# Patient Record
Sex: Female | Born: 1935 | Race: Black or African American | Hispanic: No | Smoking: Never smoker
Health system: Southern US, Community
[De-identification: ages and names within clinical notes are randomized; demographics above are authoritative.]

## PROBLEM LIST (undated history)

## (undated) DIAGNOSIS — R634 Abnormal weight loss: Secondary | ICD-10-CM

## (undated) DIAGNOSIS — D649 Anemia, unspecified: Principal | ICD-10-CM

## (undated) DIAGNOSIS — E079 Disorder of thyroid, unspecified: Secondary | ICD-10-CM

## (undated) DIAGNOSIS — I4891 Unspecified atrial fibrillation: Secondary | ICD-10-CM

## (undated) DIAGNOSIS — K769 Liver disease, unspecified: Secondary | ICD-10-CM

## (undated) DIAGNOSIS — I10 Essential (primary) hypertension: Secondary | ICD-10-CM

## (undated) DIAGNOSIS — M1990 Unspecified osteoarthritis, unspecified site: Secondary | ICD-10-CM

## (undated) DIAGNOSIS — D472 Monoclonal gammopathy: Principal | ICD-10-CM

## (undated) HISTORY — DX: Abnormal weight loss: R63.4

## (undated) HISTORY — DX: Monoclonal gammopathy: D47.2

## (undated) HISTORY — DX: Liver disease, unspecified: K76.9

## (undated) HISTORY — DX: Unspecified osteoarthritis, unspecified site: M19.90

## (undated) HISTORY — DX: Unspecified atrial fibrillation: I48.91

## (undated) HISTORY — DX: Anemia, unspecified: D64.9

## (undated) HISTORY — DX: Disorder of thyroid, unspecified: E07.9

## (undated) HISTORY — DX: Essential (primary) hypertension: I10

---

## 2000-11-20 ENCOUNTER — Ambulatory Visit (HOSPITAL_COMMUNITY): Admission: RE | Admit: 2000-11-20 | Discharge: 2000-11-20 | Payer: Self-pay | Admitting: Family Medicine

## 2008-12-22 ENCOUNTER — Encounter: Admission: RE | Admit: 2008-12-22 | Discharge: 2008-12-22 | Payer: Self-pay | Admitting: Internal Medicine

## 2010-10-26 ENCOUNTER — Ambulatory Visit: Payer: Self-pay | Admitting: Internal Medicine

## 2011-09-20 ENCOUNTER — Ambulatory Visit (INDEPENDENT_AMBULATORY_CARE_PROVIDER_SITE_OTHER): Payer: Medicare Other

## 2011-09-20 DIAGNOSIS — M25569 Pain in unspecified knee: Secondary | ICD-10-CM

## 2011-09-20 DIAGNOSIS — M12369 Palindromic rheumatism, unspecified knee: Secondary | ICD-10-CM

## 2011-09-20 DIAGNOSIS — M1710 Unilateral primary osteoarthritis, unspecified knee: Secondary | ICD-10-CM

## 2011-11-10 ENCOUNTER — Ambulatory Visit (INDEPENDENT_AMBULATORY_CARE_PROVIDER_SITE_OTHER): Payer: Medicare Other | Admitting: Family Medicine

## 2011-11-10 DIAGNOSIS — Z87898 Personal history of other specified conditions: Secondary | ICD-10-CM

## 2011-11-10 DIAGNOSIS — I10 Essential (primary) hypertension: Secondary | ICD-10-CM

## 2011-11-10 DIAGNOSIS — E039 Hypothyroidism, unspecified: Secondary | ICD-10-CM

## 2011-11-10 LAB — COMPREHENSIVE METABOLIC PANEL
ALT: <8 U/L (ref 0–35)
AST: 14 U/L (ref 0–37)
Albumin: 4.2 g/dL (ref 3.5–5.2)
Alkaline Phosphatase: 87 U/L (ref 39–117)
Potassium: 5.1 mEq/L (ref 3.5–5.3)
Sodium: 135 mEq/L (ref 135–145)
Total Bilirubin: 0.4 mg/dL (ref 0.3–1.2)
Total Protein: 7.9 g/dL (ref 6.0–8.3)

## 2011-11-10 LAB — POCT CBC
Granulocyte percent: 55.3 %G (ref 37–80)
MCV: 89.2 fL (ref 80–97)
MID (cbc): 0.6 (ref 0–0.9)
MPV: 8.2 fL (ref 0–99.8)
POC Granulocyte: 4.9 (ref 2–6.9)
POC MID %: 6.7 %M (ref 0–12)
Platelet Count, POC: 291 10*3/uL (ref 142–424)
RBC: 4.42 M/uL (ref 4.04–5.48)
RDW, POC: 14.5 %

## 2011-11-10 LAB — TSH: TSH: 0.355 u[IU]/mL (ref 0.350–4.500)

## 2011-11-10 NOTE — Progress Notes (Signed)
  Subjective:    Patient ID: Kristin Jennings, female    DOB: 1916/01/31, 78 y.o.   MRN: [id number]  HPI has been feeling well. Still worries a lot about her weight, whether she has lost more not. She denies any new major problems. Her knee has done better since her last visit.    Review of Systems HEENT negative. Respiratory unremarkable. Cardiovascular unremarkable.. GI appetite is good. GU unremarkable. Right knee swells a little but doesn't bother her really.     Objective:   Physical Exam Elderly lady alert oriented no acute distress throat clear neck supple without nodes chest clear heart regular without murmurs abdomen soft masses tenderness or mild effusion in the right knee but nothing like it was before.       Assessment & Plan:  Hypothyroidism Hypertension History weight loss, stabilized  CBC TSH and seeing that. Return when necessary or in 5 months.

## 2011-11-10 NOTE — Patient Instructions (Addendum)
Continue to try to eating as much as you can.  Return in about July.

## 2011-11-14 ENCOUNTER — Encounter: Payer: Self-pay | Admitting: *Deleted

## 2012-01-20 ENCOUNTER — Other Ambulatory Visit: Payer: Self-pay | Admitting: Family Medicine

## 2012-01-20 MED ORDER — AMLODIPINE BESYLATE 5 MG PO TABS: 5.0000 mg | ORAL_TABLET | Freq: Every day | ORAL | Status: DC

## 2012-01-20 MED ORDER — HYDROCHLOROTHIAZIDE 12.5 MG PO CAPS: 12.5000 mg | ORAL_CAPSULE | Freq: Every day | ORAL | Status: DC

## 2012-01-20 MED ORDER — LEVOTHYROXINE SODIUM 50 MCG PO TABS: 50.0000 ug | ORAL_TABLET | Freq: Every day | ORAL | Status: DC

## 2012-01-23 ENCOUNTER — Other Ambulatory Visit: Payer: Self-pay | Admitting: *Deleted

## 2012-01-23 MED ORDER — LEVOTHYROXINE SODIUM 50 MCG PO TABS: 50.0000 ug | ORAL_TABLET | Freq: Every day | ORAL | Status: DC

## 2012-02-26 ENCOUNTER — Ambulatory Visit (INDEPENDENT_AMBULATORY_CARE_PROVIDER_SITE_OTHER): Payer: Medicare Other | Admitting: Family Medicine

## 2012-02-26 ENCOUNTER — Ambulatory Visit: Payer: Medicare Other

## 2012-02-26 VITALS — BP 138/68 | HR 54 | Temp 97.5°F | Resp 14 | Ht 66.0 in | Wt 108.4 lb

## 2012-02-26 DIAGNOSIS — I10 Essential (primary) hypertension: Secondary | ICD-10-CM

## 2012-02-26 DIAGNOSIS — E039 Hypothyroidism, unspecified: Secondary | ICD-10-CM

## 2012-02-26 DIAGNOSIS — M25511 Pain in right shoulder: Secondary | ICD-10-CM

## 2012-02-26 DIAGNOSIS — M25519 Pain in unspecified shoulder: Secondary | ICD-10-CM

## 2012-02-26 MED ORDER — MELOXICAM 7.5 MG PO TABS: 7.5000 mg | ORAL_TABLET | Freq: Every day | ORAL | Status: DC

## 2012-02-26 MED ORDER — LEVOTHYROXINE SODIUM 50 MCG PO TABS: 50.0000 ug | ORAL_TABLET | Freq: Every day | ORAL | Status: DC

## 2012-02-26 MED ORDER — HYDROCHLOROTHIAZIDE 12.5 MG PO CAPS: 12.5000 mg | ORAL_CAPSULE | Freq: Every day | ORAL | Status: DC

## 2012-02-26 MED ORDER — AMLODIPINE BESYLATE 5 MG PO TABS: 5.0000 mg | ORAL_TABLET | Freq: Every day | ORAL | Status: DC

## 2012-02-26 NOTE — Patient Instructions (Signed)
Exercise shoulder as explained to you.  Return in the fall for a repeat check. (I will be gone much of October)

## 2012-02-26 NOTE — Progress Notes (Signed)
Subjective: Patient is no longer employed. However she still is very active. She was bending over on her porch to work on some flowers and fell over, hitting the ground landing on her right shoulder. This was done days ago and continues to hurt her sutures concerned examining and get checked.  Objective:  Decreased range of motion of the shoulder,, cannot lift it above about 70. Is very tender in the anterior aspect of the shoulder. Neurovascular intact.  Assessment: Right shoulder pain  Plan:  X-ray right shoulder  Results for orders placed in visit on 11/10/11  POCT CBC      Component Value Range   WBC 8.8  4.6 - 10.2 (K/uL)   Lymph, poc 3.3  0.6 - 3.4    POC LYMPH PERCENT 38.0  10 - 50 (%L)   MID (cbc) 0.6  0 - 0.9    POC MID % 6.7  0 - 12 (%M)   POC Granulocyte 4.9  2 - 6.9    Granulocyte percent 55.3  37 - 80 (%G)   RBC 4.42  4.04 - 5.48 (M/uL)   Hemoglobin 12.3  12.2 - 16.2 (g/dL)   HCT, POC 52.8  41.3 - 47.9 (%)   MCV 89.2  80 - 97 (fL)   MCH, POC 27.8  27 - 31.2 (pg)   MCHC 31.2 (*) 31.8 - 35.4 (g/dL)   RDW, POC 24.4     Platelet Count, POC 291  142 - 424 (K/uL)   MPV 8.2  0 - 99.8 (fL)  TSH      Component Value Range   TSH 0.355  0.350 - 4.500 (uIU/mL)  COMPREHENSIVE METABOLIC PANEL      Component Value Range   Sodium 135  135 - 145 (mEq/L)   Potassium 5.1  3.5 - 5.3 (mEq/L)   Chloride 102  96 - 112 (mEq/L)   CO2 26  19 - 32 (mEq/L)   Glucose, Bld 115 (*) 70 - 99 (mg/dL)   BUN 22  6 - 23 (mg/dL)   Creat 0.10  2.72 - 5.36 (mg/dL)   Total Bilirubin 0.4  0.3 - 1.2 (mg/dL)   Alkaline Phosphatase 87  39 - 117 (U/L)   AST 14  0 - 37 (U/L)   ALT <8  0 - 35 (U/L)   Total Protein 7.9  6.0 - 8.3 (g/dL)   Albumin 4.2  3.5 - 5.2 (g/dL)   Calcium 9.5  8.4 - 64.4 (mg/dL)   [facility] reading (PRIMARY) by  Dr. Alwyn Ren Osteoporosis, no fx   .

## 2012-07-05 ENCOUNTER — Ambulatory Visit (INDEPENDENT_AMBULATORY_CARE_PROVIDER_SITE_OTHER): Payer: PRIVATE HEALTH INSURANCE | Admitting: Family Medicine

## 2012-07-05 VITALS — BP 118/80 | HR 85 | Temp 98.6°F | Resp 20 | Ht 60.5 in | Wt 105.2 lb

## 2012-07-05 DIAGNOSIS — IMO0002 Reserved for concepts with insufficient information to code with codable children: Secondary | ICD-10-CM

## 2012-07-05 DIAGNOSIS — Z23 Encounter for immunization: Secondary | ICD-10-CM

## 2012-07-05 DIAGNOSIS — M1710 Unilateral primary osteoarthritis, unspecified knee: Secondary | ICD-10-CM

## 2012-07-05 DIAGNOSIS — I10 Essential (primary) hypertension: Secondary | ICD-10-CM

## 2012-07-05 DIAGNOSIS — E039 Hypothyroidism, unspecified: Secondary | ICD-10-CM | POA: Insufficient documentation

## 2012-07-05 DIAGNOSIS — M1711 Unilateral primary osteoarthritis, right knee: Secondary | ICD-10-CM | POA: Insufficient documentation

## 2012-07-05 DIAGNOSIS — M25511 Pain in right shoulder: Secondary | ICD-10-CM

## 2012-07-05 MED ORDER — AMLODIPINE BESYLATE 5 MG PO TABS: 5.0000 mg | ORAL_TABLET | Freq: Every day | ORAL | Status: DC

## 2012-07-05 MED ORDER — MELOXICAM 7.5 MG PO TABS: 7.5000 mg | ORAL_TABLET | Freq: Every day | ORAL | Status: DC

## 2012-07-05 MED ORDER — LEVOTHYROXINE SODIUM 50 MCG PO TABS: 50.0000 ug | ORAL_TABLET | Freq: Every day | ORAL | Status: DC

## 2012-07-05 MED ORDER — HYDROCHLOROTHIAZIDE 12.5 MG PO CAPS: 12.5000 mg | ORAL_CAPSULE | Freq: Every day | ORAL | Status: DC

## 2012-07-05 NOTE — Patient Instructions (Signed)
Use ice pack Knee twice daily today and tomorrow  Continue current medications. Return in 4 months

## 2012-07-05 NOTE — Progress Notes (Signed)
Subjective: 78 year old lady who functions very well. She still does some iron for people. No major new problems. Her right knee stays swollen.  Objective: Pleasant alert lady. HEENT normal. No carotid bruits. Chest clear. Heart regular without murmurs. Abdomen soft without masses or tenderness. She does have a very large effusion of the right knee. The knee Floats.  Arthrocentesis: The knee will was accessed from a lateral aspect. It was numbed with 2% lidocaine. The fluid was aspirated, over 90 cc. It was a clear yellow fluid. Specimen will be sent for synovial fluid analysis. Using sterile technique the needle was changed and the Kenalog 40+ lidocaine 2% 1 cc each was injected. The patient tolerated the procedure well. She was instructed in his care.  Assessment: DJD right knee Hypertension Hypothyroidism  Plan: Continue her current medication. Take Mobic for one week. Return if needed, otherwise in about 4 months. Flu shot will be given to

## 2012-07-06 LAB — CELL COUNT + DIFF, W/O CRYST-SYNVL FLD
Eosinophils-Synovial: 0 % (ref 0–1)
Lymphocytes-Synovial Fld: 6 % (ref 0–20)
Monocyte/Macrophage: 9 % — ABNORMAL LOW (ref 50–90)

## 2012-11-05 ENCOUNTER — Other Ambulatory Visit: Payer: Self-pay | Admitting: *Deleted

## 2012-11-05 DIAGNOSIS — I10 Essential (primary) hypertension: Secondary | ICD-10-CM

## 2012-11-05 DIAGNOSIS — E039 Hypothyroidism, unspecified: Secondary | ICD-10-CM

## 2012-11-05 MED ORDER — AMLODIPINE BESYLATE 5 MG PO TABS: 5.0000 mg | ORAL_TABLET | Freq: Every day | ORAL | Status: DC

## 2012-11-05 MED ORDER — LEVOTHYROXINE SODIUM 50 MCG PO TABS: 50.0000 ug | ORAL_TABLET | Freq: Every day | ORAL | Status: DC

## 2012-11-13 ENCOUNTER — Ambulatory Visit (INDEPENDENT_AMBULATORY_CARE_PROVIDER_SITE_OTHER): Payer: Medicare Other | Admitting: Family Medicine

## 2012-11-13 VITALS — BP 112/76 | HR 72 | Temp 97.5°F | Resp 18 | Ht 60.5 in | Wt 106.0 lb

## 2012-11-13 DIAGNOSIS — M79609 Pain in unspecified limb: Secondary | ICD-10-CM

## 2012-11-13 DIAGNOSIS — M79643 Pain in unspecified hand: Secondary | ICD-10-CM

## 2012-11-13 DIAGNOSIS — I7300 Raynaud's syndrome without gangrene: Secondary | ICD-10-CM

## 2012-11-13 DIAGNOSIS — E039 Hypothyroidism, unspecified: Secondary | ICD-10-CM

## 2012-11-13 DIAGNOSIS — I10 Essential (primary) hypertension: Secondary | ICD-10-CM

## 2012-11-13 LAB — COMPREHENSIVE METABOLIC PANEL
ALT: <8 U/L (ref 0–35)
AST: 12 U/L (ref 0–37)
Albumin: 3.8 g/dL (ref 3.5–5.2)
BUN: 20 mg/dL (ref 6–23)
CO2: 27 mEq/L (ref 19–32)
Calcium: 9.3 mg/dL (ref 8.4–10.5)
Chloride: 99 mEq/L (ref 96–112)
Creat: 0.95 mg/dL (ref 0.50–1.10)
Potassium: 4.7 mEq/L (ref 3.5–5.3)

## 2012-11-13 LAB — POCT CBC
HCT, POC: 37.8 % (ref 37.7–47.9)
Hemoglobin: 11.8 g/dL — AB (ref 12.2–16.2)
Lymph, poc: 3 (ref 0.6–3.4)
MCH, POC: 28.9 pg (ref 27–31.2)
MCHC: 31.2 g/dL — AB (ref 31.8–35.4)
MPV: 8 fL (ref 0–99.8)
POC MID %: 9.9 %M (ref 0–12)
RBC: 4.08 M/uL (ref 4.04–5.48)
WBC: 6 10*3/uL (ref 4.6–10.2)

## 2012-11-13 LAB — TSH: TSH: 4.758 u[IU]/mL — ABNORMAL HIGH (ref 0.350–4.500)

## 2012-11-13 MED ORDER — HYDRALAZINE HCL 10 MG PO TABS: ORAL_TABLET | ORAL | Status: DC

## 2012-11-13 NOTE — Progress Notes (Signed)
Subjective: 79 year old lady well-known to me who is here for blood pressure check. She's been having problems with a cold left hand off and on for the past month. She did not tell her family about it. Otherwise review of systems is really normal. Her rings or too tight on the left hand.  Objective Pleasant alert elderly lady in no distress. Neck supple without nodes. Chest clear. Heart regular without murmurs. Abdomen soft nontender. Left hand is cool to touch, blue in the fingers. Pulse is adequate. Pressures symmetrical right and left. I cut off from tiny ring it was very tight. Then the 2 rings we're able to work off.  Assessment: Raynaud's Hypertension History of hypothyroidism  Plan: Check CBC blood chemistries and TSH Stop the hydrochlorothiazide and put on hydralazine Have her come back in 2 weeks for recheck

## 2012-11-13 NOTE — Patient Instructions (Addendum)
Stop hydrochlorthiazide.  Begin hydralazine one twice daily  If hand gets worse return sooner, otherwise return in 2 weeks for recheck.  Take Aspirin 81 mg daily

## 2012-11-18 ENCOUNTER — Other Ambulatory Visit: Payer: Self-pay | Admitting: Radiology

## 2012-11-18 ENCOUNTER — Telehealth: Payer: Self-pay | Admitting: Radiology

## 2012-11-18 MED ORDER — LEVOTHYROXINE SODIUM 75 MCG PO TABS: 75.0000 ug | ORAL_TABLET | Freq: Every day | ORAL | Status: DC

## 2012-11-18 NOTE — Telephone Encounter (Signed)
I have spoken to her daughter about her thyroid meds and lab results. Advised new meds sent in to pharmacy.

## 2012-11-27 ENCOUNTER — Telehealth: Payer: Self-pay | Admitting: Radiology

## 2012-11-27 ENCOUNTER — Ambulatory Visit (INDEPENDENT_AMBULATORY_CARE_PROVIDER_SITE_OTHER): Payer: Medicare Other | Admitting: Family Medicine

## 2012-11-27 ENCOUNTER — Other Ambulatory Visit: Payer: Self-pay | Admitting: Family Medicine

## 2012-11-27 ENCOUNTER — Ambulatory Visit: Payer: Medicare Other

## 2012-11-27 VITALS — BP 110/64 | HR 62 | Temp 97.6°F | Resp 12

## 2012-11-27 DIAGNOSIS — IMO0002 Reserved for concepts with insufficient information to code with codable children: Secondary | ICD-10-CM

## 2012-11-27 DIAGNOSIS — M25561 Pain in right knee: Secondary | ICD-10-CM

## 2012-11-27 DIAGNOSIS — M25569 Pain in unspecified knee: Secondary | ICD-10-CM

## 2012-11-27 DIAGNOSIS — M25469 Effusion, unspecified knee: Secondary | ICD-10-CM

## 2012-11-27 DIAGNOSIS — M25461 Effusion, right knee: Secondary | ICD-10-CM

## 2012-11-27 DIAGNOSIS — M1711 Unilateral primary osteoarthritis, right knee: Secondary | ICD-10-CM

## 2012-11-27 DIAGNOSIS — I7300 Raynaud's syndrome without gangrene: Secondary | ICD-10-CM

## 2012-11-27 LAB — POCT CBC
Granulocyte percent: 70.2 %G (ref 37–80)
HCT, POC: 38.3 % (ref 37.7–47.9)
MCH, POC: 28.1 pg (ref 27–31.2)
MCV: 92.7 fL (ref 80–97)
RBC: 4.13 M/uL (ref 4.04–5.48)
WBC: 7.8 10*3/uL (ref 4.6–10.2)

## 2012-11-27 LAB — POCT SEDIMENTATION RATE: POCT SED RATE: 90 mm/hr — AB (ref 0–22)

## 2012-11-27 MED ORDER — NABUMETONE 500 MG PO TABS: 500.0000 mg | ORAL_TABLET | Freq: Two times a day (BID) | ORAL | Status: DC

## 2012-11-27 NOTE — Telephone Encounter (Signed)
[facility] [address]

## 2012-11-27 NOTE — Progress Notes (Signed)
Subjective: For 2 days the right knee has gotten progressively more swollen and painful. Knows of no specific injury. It was not down on her knees.  The blueness is of the fingers of the left hand is doing a little bit better but still looks abnormal to her.  Objective: Right hand looks about the same as the left hand right now.  Vital signs are stable on the medicine change  Knee is extremely swollen with a very large effusion. Moderately warm to touch. Very tender.  Assessment: Knee pain and effusion Raynaud's, improved  Plan: CBC  CBC appears normal.  Using sterile technique I aspirated 110 mL of slightly cloudy fluid. It turned a little bit bloody as I aspirated. I had some difficulty finally keeping him out of pocket, and though there is still some fluid present I did not continue to try to aspirate. Using a separate needle and syringe I injected 80 mg of Depo-Medrol 80+1.5 mL's of 2% lidocaine. Patient tolerated the procedure well.  Assessment: DJD with effusion. Last time we did this the effusion was a little bit cloudy also. Sent for synovial fluid analysis and culture, and will treat with NSAIDs. Only for a few days however because of her age. Uric acid X-ray  [facility] reading (PRIMARY) by  Dr. Alwyn Ren djd  .  Pronounced phenomena is improving DJD  C. treatment orders. We'll try Relafen 500 twice a day for about 10 days

## 2012-11-27 NOTE — Addendum Note (Signed)
Addended by: Tilman Neat on: 11/27/2012 02:32 PM   Modules accepted: Orders

## 2012-11-27 NOTE — Patient Instructions (Addendum)
Use ice on the knee 4 0r 5 times daily for several days for about 15 minutes at a time.  Take the medicine one pill twice daily for the knee with food. If it upsets your stomach, stop promptly.  Return if not improving

## 2012-11-27 NOTE — Addendum Note (Signed)
Addended by: Honor Loh on: 11/27/2012 02:37 PM   Modules accepted: Orders

## 2012-11-28 LAB — SYNOVIAL CELL COUNT + DIFF, W/ CRYSTALS
Lymphocytes-Synovial Fld: 5 % (ref 0–20)
Monocyte/Macrophage: 11 % — ABNORMAL LOW (ref 50–90)
Neutrophil, Synovial: 84 % — ABNORMAL HIGH (ref 0–25)
WBC, Synovial: 32195 cu mm — ABNORMAL HIGH (ref 0–200)

## 2012-11-30 ENCOUNTER — Telehealth: Payer: Self-pay | Admitting: Family Medicine

## 2012-11-30 NOTE — Telephone Encounter (Signed)
I called and spoke to the patient's daughter. She says her knee is doing real good. I cautioned them that there are a lot of pus cells in the knee, and if it was infected he should be getting worse. However at any time it is getting acutely worse they need to bring her right back in here to get rechecked.  I was unable to call yesterday because there was no communication with the power out in computers out and cell phones not rechargeable.

## 2012-12-01 LAB — BODY FLUID CULTURE
Gram Stain: NONE SEEN
Organism ID, Bacteria: NO GROWTH

## 2012-12-02 ENCOUNTER — Ambulatory Visit (INDEPENDENT_AMBULATORY_CARE_PROVIDER_SITE_OTHER): Payer: Medicare Other | Admitting: Family Medicine

## 2012-12-02 VITALS — BP 128/62 | HR 70 | Temp 97.5°F | Resp 18 | Wt 109.6 lb

## 2012-12-02 DIAGNOSIS — M25469 Effusion, unspecified knee: Secondary | ICD-10-CM

## 2012-12-02 DIAGNOSIS — M1711 Unilateral primary osteoarthritis, right knee: Secondary | ICD-10-CM

## 2012-12-02 DIAGNOSIS — M25461 Effusion, right knee: Secondary | ICD-10-CM

## 2012-12-02 LAB — POCT CBC
HCT, POC: 38.3 % (ref 37.7–47.9)
Hemoglobin: 11.6 g/dL — AB (ref 12.2–16.2)
MCH, POC: 28.5 pg (ref 27–31.2)
MPV: 8 fL (ref 0–99.8)
POC MID %: 6.2 %M (ref 0–12)
RBC: 4.07 M/uL (ref 4.04–5.48)
WBC: 6 10*3/uL (ref 4.6–10.2)

## 2012-12-02 NOTE — Patient Instructions (Addendum)
Return if worse

## 2012-12-02 NOTE — Progress Notes (Signed)
Subjective: Knee feels much better  Objective: Culture did not grow anything. The needle and no longer feels hot. There is no recurrent effusion.  Assessment: Acute knee arthritis and effusion, resolved  Plan: Repeat the CBC to make sure it is come down. Cautioned her about getting treatment early in that knee flares. It will probably need injections in it every few months. At her age I would rather he checked it more often if needed but avoid anything surgical on it if at all possible.  Results for orders placed in visit on 12/02/12  POCT CBC      Result Value Range   WBC 6.0  4.6 - 10.2 K/uL   Lymph, poc 2.5  0.6 - 3.4   POC LYMPH PERCENT 42.0  10 - 50 %L   MID (cbc) 0.4  0 - 0.9   POC MID % 6.2  0 - 12 %M   POC Granulocyte 3.1  2 - 6.9   Granulocyte percent 51.8  37 - 80 %G   RBC 4.07  4.04 - 5.48 M/uL   Hemoglobin 11.6 (*) 12.2 - 16.2 g/dL   HCT, POC 45.4  09.8 - 47.9 %   MCV 94.1  80 - 97 fL   MCH, POC 28.5  27 - 31.2 pg   MCHC 30.3 (*) 31.8 - 35.4 g/dL   RDW, POC 11.9     Platelet Count, POC 350  142 - 424 K/uL   MPV 8.0  0 - 99.8 fL

## 2012-12-12 ENCOUNTER — Ambulatory Visit (INDEPENDENT_AMBULATORY_CARE_PROVIDER_SITE_OTHER): Payer: Medicare Other | Admitting: Cardiology

## 2012-12-12 ENCOUNTER — Other Ambulatory Visit: Payer: Self-pay | Admitting: Family Medicine

## 2012-12-12 ENCOUNTER — Telehealth: Payer: Self-pay | Admitting: *Deleted

## 2012-12-12 ENCOUNTER — Encounter: Payer: Self-pay | Admitting: Cardiology

## 2012-12-12 ENCOUNTER — Ambulatory Visit (INDEPENDENT_AMBULATORY_CARE_PROVIDER_SITE_OTHER): Payer: Medicare Other | Admitting: Family Medicine

## 2012-12-12 VITALS — BP 116/60 | HR 94 | Ht 60.5 in | Wt 109.0 lb

## 2012-12-12 VITALS — BP 108/60 | HR 69 | Temp 97.7°F | Resp 16

## 2012-12-12 DIAGNOSIS — I499 Cardiac arrhythmia, unspecified: Secondary | ICD-10-CM

## 2012-12-12 DIAGNOSIS — I4891 Unspecified atrial fibrillation: Secondary | ICD-10-CM

## 2012-12-12 DIAGNOSIS — M11269 Other chondrocalcinosis, unspecified knee: Secondary | ICD-10-CM

## 2012-12-12 DIAGNOSIS — M25469 Effusion, unspecified knee: Secondary | ICD-10-CM

## 2012-12-12 DIAGNOSIS — M25461 Effusion, right knee: Secondary | ICD-10-CM

## 2012-12-12 NOTE — Progress Notes (Signed)
HPI Mrs. Kristin Jennings is referred today by Warner Mccreedy for new onset atrial fib.  She is totally asymptomatic. She denies any chest pain, palpitations, presyncope or syncope.  She takes aspirin twice a day.  Past Medical History  Diagnosis Date  . Arthritis     Current Outpatient Prescriptions  Medication Sig Dispense Refill  . amLODipine (NORVASC) 5 MG tablet Take 1 tablet (5 mg total) by mouth daily. NEEDS OFFICE VISIT  30 tablet  0  . hydrALAZINE (APRESOLINE) 10 MG tablet Take one twice daily for circulation  60 tablet  1  . hydrochlorothiazide (MICROZIDE) 12.5 MG capsule Take 1 capsule (12.5 mg total) by mouth daily.  30 capsule  5  . levothyroxine (SYNTHROID) 75 MCG tablet Take 1 tablet (75 mcg total) by mouth daily.  30 tablet  5  . meloxicam (MOBIC) 7.5 MG tablet Take 1 tablet (7.5 mg total) by mouth daily.  7 tablet  0  . nabumetone (RELAFEN) 500 MG tablet Take 1 tablet (500 mg total) by mouth 2 (two) times daily.  20 tablet  0   No current facility-administered medications for this visit.    Allergies  Allergen Reactions  . Lisinopril Cough    History reviewed. No pertinent family history.  History   Social History  . Marital Status: Widowed    Spouse Name: N/A    Number of Children: N/A  . Years of Education: N/A   Occupational History  . Not on file.   Social History Main Topics  . Smoking status: Never Smoker   . Smokeless tobacco: Not on file  . Alcohol Use: No  . Drug Use: No  . Sexually Active: Not Currently   Other Topics Concern  . Not on file   Social History Narrative  . No narrative on file    ROS ALL NEGATIVE EXCEPT THOSE NOTED IN HPI  PE  General Appearance: well developed, well nourished in no acute distress, elderly HEENT: symmetrical face, PERRLA, arcus senilis Neck: no JVD, thyromegaly, or adenopathy, trachea midline Chest: symmetric without deformity Cardiac: PMI non-displaced, irregular rate and, normal S1, S2, no gallop or  murmur Lung: clear to ausculation and percussion Vascular: Right carotid bruit, good upstroke, decreased pulses in lower extremities but present Abdominal: nondistended, nontender, good bowel sounds, no HSM, no bruits Extremities: no cyanosis, clubbing or edema, no sign of DVT, no varicosities  Skin: normal color, no rashes Neuro: alert and oriented x 3, non-focal Pysch: normal affect  EKG Outside EKG shows atrial fib with RSR prime, rate about 90-100.  BMET    Component Value Date/Time   NA 135 11/13/2012 1426   K 4.7 11/13/2012 1426   CL 99 11/13/2012 1426   CO2 27 11/13/2012 1426   GLUCOSE 96 11/13/2012 1426   BUN 20 11/13/2012 1426   CREATININE 0.95 11/13/2012 1426   CALCIUM 9.3 11/13/2012 1426    Lipid Panel  No results found for this basename: chol, trig, hdl, cholhdl, vldl, ldlcalc    CBC    Component Value Date/Time   WBC 6.0 12/02/2012 1053   RBC 4.07 12/02/2012 1053   HGB 11.6* 12/02/2012 1053   HCT 38.3 12/02/2012 1053   MCV 94.1 12/02/2012 1053   MCH 28.5 12/02/2012 1053   MCHC 30.3* 12/02/2012 1053

## 2012-12-12 NOTE — Assessment & Plan Note (Signed)
Duration of this is not known. She's totally asymptomatic with relatively well-controlled ventricular rate of about 90 in the office. We'll continue same medications. She is taking aspirin 81 mg twice a day. I do not feel she is a Coumadin candidate. Followup when necessary. Primary care can  check labs including a TSH. She is not clinically hyperthyroid. Her atrial fib can  be explained by age and history of hypertension. Daughter and patient happy with this plan.

## 2012-12-12 NOTE — Patient Instructions (Addendum)
You will be seen at [facility] cardiology today at 10:30 am- their office is located on [facility]  Address: [address] [place], [place], [place] [address] [phone]   I will let you know the results of your knee fluid studies.  Please use your walker for balance, and keep an ace bandage/ ice on the knee to reduce swelling.

## 2012-12-12 NOTE — Telephone Encounter (Signed)
Amy from Dr. Durel Salts office calls today for pt to be seen with new onset atrial fib. ECG in Epic. Pt asymptomatic according to Amy. Appt made for 10:30am today Mylo Red RN

## 2012-12-12 NOTE — Progress Notes (Addendum)
[facility] [address], [place] [address] [phone]  Date:  12/12/2012   Name:  Kristin Jennings   DOB:  1916/08/14   MRN:  [id number]  PCP:  Janace Hoard, MD    Chief Complaint: Joint Swelling   History of Present Illness:  Kristin Jennings is a 79 y.o. very pleasant female patient who presents with the following:  Here today with recurrent right knee pain- she was here on 11/27/12 with 2 days of progressive right knee pain and swelling, was noted to have a swollen knee with a large effusion.  She had an aspiration and injection of depo- medrol and lidocaine.    Returned 12/02/12 to recheck her knee-  her culture from 3/5 did not grow any bacteria.  She may have CPPD per radiology report.  Her knee began to bother her again over the last 2 days.  She had been on her feet a lot, being quite active and this seemed to cause her knee to flare up again.   She is not able to walk on the knee very well and is using one of our wheelchairs today.   She has not noted any fever or chills.  She did not fall, no other acute injury noted.   She does not use a WC at home and does not have a walker    She has had trouble with her knee for about 2 years now.     X-ray report from 11/27/12: RADIOLOGY REPORT*  Clinical Data: Right knee pain and swelling, no acute injury  RIGHT KNEE - 1-2 VIEW  Comparison: None.  Findings: There is tricompartmental degenerative joint disease with  the greatest involvement being in the lateral compartment where  there is loss of joint space, sclerosis, and spurring. There is  chondrocalcinosis present. Faint calcification is noted adjacent  to the lateral femoral condyle which may also be related to the  chondrocalcinosis and probable CPPD. There is a large right knee  joint effusion present. The bones are osteopenic.  IMPRESSION:  Changes of osteoarthritis and probable CPPD as described above.  Large right knee joint effusion.    Noted irregular heart rhythm today- she has not noted any palpitations, chest pain, or syncope.  She has not been told she had an irregular rate in the past.  She is not aware of any cardiac problems except for HTN, and has never seen a cardiologist that she can recall.    TSH from last month- 4.748 Patient Active Problem List  Diagnosis  . HTN (hypertension)  . Hypothyroidism  . Osteoarthritis of right knee    Past Medical History  Diagnosis Date  . Arthritis     History reviewed. No pertinent past surgical history.  History  Substance Use Topics  . Smoking status: Never Smoker   . Smokeless tobacco: Not on file  . Alcohol Use: No    History reviewed. No pertinent family history.  Allergies  Allergen Reactions  . Lisinopril Cough    Medication list has been reviewed and updated.  Current Outpatient Prescriptions on File Prior to Visit  Medication Sig Dispense Refill  . amLODipine (NORVASC) 5 MG tablet Take 1 tablet (5 mg total) by mouth daily. NEEDS OFFICE VISIT  30 tablet  0  . hydrALAZINE (APRESOLINE) 10 MG tablet Take one twice daily for circulation  60 tablet  1  . hydrochlorothiazide (MICROZIDE) 12.5 MG capsule Take 1 capsule (12.5 mg total) by mouth daily.  30 capsule  5  . levothyroxine (SYNTHROID) 75 MCG tablet Take 1 tablet (75 mcg total) by mouth daily.  30 tablet  5  . levothyroxine (SYNTHROID, LEVOTHROID) 50 MCG tablet Take 1 tablet (50 mcg total) by mouth daily. NEEDS OFFICE VISIT  30 tablet  0  . meloxicam (MOBIC) 7.5 MG tablet Take 1 tablet (7.5 mg total) by mouth daily.  7 tablet  0  . nabumetone (RELAFEN) 500 MG tablet Take 1 tablet (500 mg total) by mouth 2 (two) times daily.  20 tablet  0   No current facility-administered medications on file prior to visit.    Review of Systems:  As per HPI- otherwise negative. Here with a family member today  Physical Examination: Filed Vitals:   12/12/12 0758  BP: 108/60  Pulse: 69  Temp: 97.7 F  (36.5 C)  Resp: 16   There were no vitals filed for this visit. There is no weight on file to calculate BMI. Ideal Body Weight:    GEN: WDWN, NAD, Non-toxic, A & O x 3, appears stated age, sitting in WC today HEENT: Atraumatic, Normocephalic. Neck supple. No masses, No LAD. Ears and Nose: No external deformity. CV: No M/G/R. No JVD. No thrill. No extra heart sounds.  Irregularly irregular rhythm PULM: CTA B, no wheezes, crackles, rhonchi. No retractions. No resp. distress. No accessory muscle use. EXTR: No c/c/e NEURO right knee pain, in [facility] PSYCH: Normally interactive. Conversant. Not depressed or anxious appearing.  Calm demeanor.  Right knee: slightly warm, large effusion apparent,  No wound, no redness.  Limited flexion due to swelling, able to fully extend knee.  Knee stable, no apparent ligamentous instability.   Calf is normal  VC obtained.  Knee prepped with betadine x3 and alcohol.  5ml of 1% lidocaine used to form a track for local anesthesia.  Aspirated knee with an 20 gauge needle and removed approx 50 ml of yellow, clear fluid.  Sent for analysis.  Dressed with band- aid and an ace bandage  EKG: irregular rhythm- seems most consistent with intermittent a. Fib, although she does have some conducted beats.  We were not able to obtain a rhythm strip due to problems with our machine today Assessment and Plan: Irregular heart rhythm - Plan: EKG 12-Lead  Swelling of right knee joint - Plan: Culture, body fluid-bottle, Cell count + diff,  w/ cryst-synvl fld  Effusion of right knee - Plan: Culture, body fluid-bottle, Cell count + diff,  w/ cryst-synvl fld  Atrial fibrillation - Plan: Ambulatory referral to Cardiology  79 year old woman with recurrent right knee effusions, likely related to her CPPD arthritis.  She is not interested in orthopedic consultation at this time.  Removed joint fluid and sent for analysis, gave rx for a walker and encouraged ice and ace bandage for light  pressure  Irregular heart rhythm/ presumed new onset atrial fibrillation.  At this time stable and asymptomatic.  Referral to see Dr. Daleen Squibb today- appreciate his kind consultation for this patient.    Signed Abbe Amsterdam, MD

## 2012-12-12 NOTE — Patient Instructions (Addendum)
No changes were made to your medications today.

## 2012-12-13 ENCOUNTER — Encounter: Payer: Self-pay | Admitting: Family Medicine

## 2012-12-13 DIAGNOSIS — M11269 Other chondrocalcinosis, unspecified knee: Secondary | ICD-10-CM | POA: Insufficient documentation

## 2012-12-13 LAB — SYNOVIAL CELL COUNT + DIFF, W/ CRYSTALS
Eosinophils-Synovial: 0 % (ref 0–1)
Lymphocytes-Synovial Fld: 0 % (ref 0–20)
Neutrophil, Synovial: 93 % — ABNORMAL HIGH (ref 0–25)
WBC, Synovial: 36325 cu mm — ABNORMAL HIGH (ref 0–200)

## 2012-12-15 ENCOUNTER — Other Ambulatory Visit: Payer: Self-pay | Admitting: Family Medicine

## 2012-12-15 LAB — BODY FLUID CULTURE
Gram Stain: NONE SEEN
Organism ID, Bacteria: NO GROWTH

## 2012-12-15 NOTE — Progress Notes (Signed)
Noted. Thanks.

## 2012-12-16 ENCOUNTER — Telehealth: Payer: Self-pay

## 2012-12-16 NOTE — Telephone Encounter (Signed)
She has refills at [facility]. [facility]. Called her daughter to advise.

## 2012-12-16 NOTE — Telephone Encounter (Signed)
Dr Patsy Lager, do you want to RF this for pt's knee?

## 2012-12-16 NOTE — Telephone Encounter (Signed)
PT'S GRANDDAUGHTER WAS CALLING IN REGARD TO THE PT'S REFILL ON LEVOTHYROXINE.  THE PT IS COMPLETELY OUT OF THIS AND USES [facility] FOR HER MEDICATIONS.  [facility] IS OUT OF THIS MEDICINE AND WANTED TO KNOW IF SHE WANTED A GENERIC.  SHE DOES NOT AND IS GOING TO [facility]Y [facility] GET [facility] ON [address] TO [facility]ILL [facility].  DO WE HAVE TO CALL THEM TO HAVE THIS FILLED THERE?  [phone] IS CYNTHIAS NUMBER.

## 2013-01-14 ENCOUNTER — Other Ambulatory Visit: Payer: Self-pay

## 2013-01-14 MED ORDER — LEVOTHYROXINE SODIUM 75 MCG PO TABS: 75.0000 ug | ORAL_TABLET | Freq: Every day | ORAL | Status: DC

## 2013-01-20 ENCOUNTER — Ambulatory Visit: Payer: Medicare Other

## 2013-01-20 ENCOUNTER — Ambulatory Visit (INDEPENDENT_AMBULATORY_CARE_PROVIDER_SITE_OTHER): Payer: Medicare Other | Admitting: Family Medicine

## 2013-01-20 VITALS — BP 112/68 | HR 54 | Temp 98.1°F | Resp 16

## 2013-01-20 DIAGNOSIS — M25511 Pain in right shoulder: Secondary | ICD-10-CM

## 2013-01-20 DIAGNOSIS — I4891 Unspecified atrial fibrillation: Secondary | ICD-10-CM

## 2013-01-20 DIAGNOSIS — M25519 Pain in unspecified shoulder: Secondary | ICD-10-CM

## 2013-01-20 LAB — COMPREHENSIVE METABOLIC PANEL
ALT: <8 U/L (ref 0–35)
AST: 7 U/L (ref 0–37)
Alkaline Phosphatase: 89 U/L (ref 39–117)
BUN: 17 mg/dL (ref 6–23)
Calcium: 9.6 mg/dL (ref 8.4–10.5)
Chloride: 96 mEq/L (ref 96–112)
Creat: 0.87 mg/dL (ref 0.50–1.10)
Total Bilirubin: 0.4 mg/dL (ref 0.3–1.2)

## 2013-01-20 NOTE — Progress Notes (Addendum)
[facility] [address] [place] [address] [phone]  Date:  01/20/2013   Name:  Kristin Jennings   DOB:  1916/05/26   MRN:  [id number]  PCP:  Janace Hoard, MD    Chief Complaint: Arm Pain   History of Present Illness:  Kristin Jennings is a 79 y.o. very pleasant female patient who presents with the following:  She is here today with a right shoulder problem.  She has noted pain in the shoulder for about one week.  No falls, no known injury.  She cannot remember if it might have bothered her in the past.  She has not tried any particular medication for her shoulder so far.   She is taking aspirin daily due to recenlty noted atrial fibrilation.  She does need a TSH check which we can do today Dr. Alwyn Ren had given her some relafen in the past for pain, but she is not using it as "It's too strong."    Patient Active Problem List  Diagnosis  . HTN (hypertension)  . Hypothyroidism  . Osteoarthritis of right knee  . Atrial fibrillation  . Pseudogout of knee    Past Medical History  Diagnosis Date  . Arthritis     History reviewed. No pertinent past surgical history.  History  Substance Use Topics  . Smoking status: Never Smoker   . Smokeless tobacco: Not on file  . Alcohol Use: No    History reviewed. No pertinent family history.  Allergies  Allergen Reactions  . Lisinopril Cough    Medication list has been reviewed and updated.  Current Outpatient Prescriptions on File Prior to Visit  Medication Sig Dispense Refill  . amLODipine (NORVASC) 5 MG tablet Take 1 tablet (5 mg total) by mouth daily. NEEDS OFFICE VISIT  30 tablet  0  . aspirin 81 MG tablet Take 81 mg by mouth 2 (two) times daily.      . hydrALAZINE (APRESOLINE) 10 MG tablet Take one twice daily for circulation  60 tablet  1  . hydrochlorothiazide (MICROZIDE) 12.5 MG capsule Take 1 capsule (12.5 mg total) by mouth daily.  30 capsule  5  . levothyroxine (SYNTHROID) 75 MCG  tablet Take 1 tablet (75 mcg total) by mouth daily.  90 tablet  1  . meloxicam (MOBIC) 7.5 MG tablet Take 1 tablet (7.5 mg total) by mouth daily.  7 tablet  0  . nabumetone (RELAFEN) 500 MG tablet TAKE 1 TABLET BY MOUTH TWICE A DAY  20 tablet  0   No current facility-administered medications on file prior to visit.    Review of Systems:  As per HPI- otherwise negative.   Physical Examination: Filed Vitals:   01/20/13 1028  BP: 112/68  Pulse: 54  Temp: 98.1 F (36.7 C)  Resp: 16   There were no vitals filed for this visit. There is no weight on file to calculate BMI. Ideal Body Weight:    GEN: WDWN, NAD, Non-toxic, A & O x 3, frail, elderly, sitting in wheelchair HEENT: Atraumatic, Normocephalic. Neck supple. No masses, No LAD. Ears and Nose: No external deformity. CV: irregularly irregular consistent with a fib (known), No M/G/R. No JVD. No thrill. No extra heart sounds. PULM: CTA B, no wheezes, crackles, rhonchi. No retractions. No resp. distress. No accessory muscle use. EXTR: No c/c/e NEURO Normal gait.  PSYCH: Normally interactive. Conversant. Not depressed or anxious appearing.  Calm demeanor.  Right shoulder: limited ROM, about 1/2 to  2/3 normal range with flexion, internal rotation and external rotation.  Has discomfort with ROM.  Joint is located, no redness or swelling.  Also has tenderness over her right scapula, but no swelling, redness or lesion. Able to reproduce her pain with manipulation of her right shoulder.  The left shoulder is much better with grossly normal ROM  [facility] reading (PRIMARY) by  Dr. Patsy Lager. Right scapula and shoulder: normal except for osteopenia   RIGHT SCAPULA - 2+ VIEWS  Comparison: Current and previous right shoulder radiographs.  Findings: Two views of the right scapula demonstrate a normal appearing scapula. Please see the right shoulder Report for the shoulder findings.  IMPRESSION: Normal right scapula.  Clinically significant  discrepancy from primary report, if provided: None  RIGHT SHOULDER - 2+ VIEW  Comparison: 02/26/2012.  Findings: Two frontal views of the right shoulder demonstrate progressive superior migration of the humeral head with narrowing of the acromial humeral distance. There are secondary hypertrophic changes of the bone in this region as well as a mild to moderate inferior glenohumeral spur formation.  IMPRESSION: Right shoulder degenerative changes with progressive superior migration of the humeral head. This suggests a large, chronic rotator cuff tear.   Assessment and Plan: Pain in joint, shoulder region, right - Plan: DG Shoulder Right, DG Scapula Right  Atrial fibrillation - Plan: Comprehensive metabolic panel, TSH  Results for orders placed in visit on 01/20/13  COMPREHENSIVE METABOLIC PANEL      Result Value Range   Sodium 135  135 - 145 mEq/L   Potassium 5.1  3.5 - 5.3 mEq/L   Chloride 96  96 - 112 mEq/L   CO2 25  19 - 32 mEq/L   Glucose, Bld 98  70 - 99 mg/dL   BUN 17  6 - 23 mg/dL   Creat 7.82  9.56 - 2.13 mg/dL   Total Bilirubin 0.4  0.3 - 1.2 mg/dL   Alkaline Phosphatase 89  39 - 117 U/L   AST 7  0 - 37 U/L   ALT <8  0 - 35 U/L   Total Protein 7.8  6.0 - 8.3 g/dL   Albumin 3.0 (*) 3.5 - 5.2 g/dL   Calcium 9.6  8.4 - 08.6 mg/dL  TSH      Result Value Range   TSH       Await TSH.  Shoulder pain and degenerative change.  Suggested tylenol as needed for pain.  Encouraged gentle ROM exercise at home to maintain what she has.  PT and/ or ortho referral would be the next step- however given her age [name] is not sure she wants to undergo any more therapy.  Will talk with her further when her TSH comes in.    Signed Abbe Amsterdam, MD  01/12/13- called daughter Wynona Canes, could not reach her.  Will send letter with x-ray report and lab details.

## 2013-01-20 NOTE — Patient Instructions (Addendum)
Let me know if you would like to pursue physical therapy or see an orthopedist for your shoulder.  Try to maintain the range of motion that you have by moving your shoulder around  You can try tylenol for your shoulder pain.  Let me know if not helpful.

## 2013-02-06 ENCOUNTER — Other Ambulatory Visit: Payer: Self-pay | Admitting: Family Medicine

## 2013-02-21 ENCOUNTER — Ambulatory Visit (INDEPENDENT_AMBULATORY_CARE_PROVIDER_SITE_OTHER): Payer: Medicare Other | Admitting: Family Medicine

## 2013-02-21 ENCOUNTER — Ambulatory Visit: Payer: Medicare Other

## 2013-02-21 VITALS — BP 108/68 | Temp 98.0°F | Resp 18 | Ht 65.0 in | Wt 105.0 lb

## 2013-02-21 DIAGNOSIS — R5383 Other fatigue: Secondary | ICD-10-CM

## 2013-02-21 DIAGNOSIS — D649 Anemia, unspecified: Secondary | ICD-10-CM

## 2013-02-21 DIAGNOSIS — M542 Cervicalgia: Secondary | ICD-10-CM

## 2013-02-21 DIAGNOSIS — R4781 Slurred speech: Secondary | ICD-10-CM

## 2013-02-21 DIAGNOSIS — R531 Weakness: Secondary | ICD-10-CM

## 2013-02-21 DIAGNOSIS — M129 Arthropathy, unspecified: Secondary | ICD-10-CM

## 2013-02-21 DIAGNOSIS — R4789 Other speech disturbances: Secondary | ICD-10-CM

## 2013-02-21 DIAGNOSIS — M1990 Unspecified osteoarthritis, unspecified site: Secondary | ICD-10-CM

## 2013-02-21 DIAGNOSIS — R5381 Other malaise: Secondary | ICD-10-CM

## 2013-02-21 LAB — BASIC METABOLIC PANEL
BUN: 29 mg/dL — ABNORMAL HIGH (ref 6–23)
Glucose, Bld: 95 mg/dL (ref 70–99)
Potassium: 4.9 mEq/L (ref 3.5–5.3)

## 2013-02-21 LAB — BASIC METABOLIC PANEL WITH GFR
CO2: 26 meq/L (ref 19–32)
Calcium: 9.2 mg/dL (ref 8.4–10.5)
Chloride: 99 meq/L (ref 96–112)
Creat: 0.86 mg/dL (ref 0.50–1.10)
Sodium: 136 meq/L (ref 135–145)

## 2013-02-21 LAB — POCT CBC
Granulocyte percent: 55.7 %G (ref 37–80)
HCT, POC: 31.2 % — AB (ref 37.7–47.9)
Hemoglobin: 9.2 g/dL — AB (ref 12.2–16.2)
Lymph, poc: 2.1 (ref 0.6–3.4)
MCH, POC: 24.1 pg — AB (ref 27–31.2)
MCHC: 29.5 g/dL — AB (ref 31.8–35.4)
MCV: 81.7 fL (ref 80–97)
MID (cbc): 0.6 (ref 0–0.9)
MPV: 6.8 fL (ref 0–99.8)
POC Granulocyte: 3.3 (ref 2–6.9)
POC LYMPH PERCENT: 34.9 %L (ref 10–50)
POC MID %: 9.4 % (ref 0–12)
Platelet Count, POC: 451 10*3/uL — AB (ref 142–424)
RBC: 3.82 M/uL — AB (ref 4.04–5.48)
RDW, POC: 19.8 %
WBC: 5.9 10*3/uL (ref 4.6–10.2)

## 2013-02-21 LAB — IFOBT (OCCULT BLOOD): IFOBT: NEGATIVE

## 2013-02-21 MED ORDER — DICLOFENAC SODIUM 1 % TD GEL: 2.0000 g | Freq: Three times a day (TID) | TRANSDERMAL | Status: DC | PRN

## 2013-02-21 NOTE — Progress Notes (Signed)
[facility]:  Office Visit  Chief Complaint:  Chief Complaint  Patient presents with  . Neck Pain    HPI: Kristin Jennings is a 79 y.o. female who complains of : 1. Neck pain-started Monday, woke up Monday and it was stiff and she can turn around her neck slowly. Per daughter she had similar sxs before. Denies numbness/tingling.  2. Weakness- Per daughter she has had off and on weakness, every other day where she would be feeling good and able to move on her own and then the next day she would need help with everything . Some days are better and some days are worse and she needs help. Monday she got confused  for the first time on the telephone.  Denies problems urinating/UTI sxs. Tuesday she was a little different, she had some + slurred speech, +mild right sided facial dropping. All of these sxs are no longer present. She has right shoulder pain and knee arthritis so there is some weakness related to that.  3. Anemia-has never had colonscopy, up until 2 months ago she had a hgb between 11-12. Denies hematuria/melena/BRBPR   Past Medical History  Diagnosis Date  . Arthritis   . Hypertension   . Atrial fibrillation   . Thyroid disease    History reviewed. No pertinent past surgical history. History   Social History  . Marital Status: Widowed    Spouse Name: N/A    Number of Children: N/A  . Years of Education: N/A   Social History Main Topics  . Smoking status: Never Smoker   . Smokeless tobacco: None  . Alcohol Use: No  . Drug Use: No  . Sexually Active: Not Currently   Other Topics Concern  . None   Social History Narrative  . None   History reviewed. No pertinent family history. Allergies  Allergen Reactions  . Lisinopril Cough   Prior to Admission medications   Medication Sig Start Date End Date Taking? Authorizing Provider  amLODipine (NORVASC) 5 MG tablet Take 1 tablet (5 mg total) by mouth daily. NEEDS OFFICE VISIT 11/05/12  Yes  Godfrey Pick, PA-C  hydrALAZINE (APRESOLINE) 10 MG tablet TAKE 1 TABLET BY MOUTH TWICE A DAY FOR CIRCULATION 02/06/13  Yes [name], MD  hydrochlorothiazide (MICROZIDE) 12.5 MG capsule Take 1 capsule (12.5 mg total) by mouth daily. 07/05/12  Yes [name], MD  levothyroxine (SYNTHROID) 75 MCG tablet Take 1 tablet (75 mcg total) by mouth daily. 01/14/13  Yes [name], MD  aspirin 81 MG tablet Take 81 mg by mouth 2 (two) times daily.    Historical Provider, MD     ROS: The patient denies fevers, chills, night sweats, unintentional weight loss, chest pain, palpitations, wheezing, dyspnea on exertion, nausea, vomiting, abdominal pain, dysuria, hematuria, melena, numbness,  or tingling. +  weakness  All other systems have been reviewed and were otherwise negative with the exception of those mentioned in the HPI and as above.    PHYSICAL EXAM: Filed Vitals:   02/21/13 1013  BP: 108/68  Temp: 98 F (36.7 C)  Resp: 18   Filed Vitals:   02/21/13 1013  Height: 5\' 5"  (1.651 m)  Weight: 105 lb (47.628 kg)   Body mass index is 17.47 kg/(m^2).  General: Alert, no acute distress HEENT:  Normocephalic, atraumatic, oropharynx patent. EOMI, PERRLA Cardiovascular:  Irregularly irreg, no rubs murmurs or gallops.  No Carotid bruits, radial pulse intact. No pedal edema.  Respiratory: Clear to auscultation bilaterally.  No wheezes, rales, or rhonchi.  No cyanosis, no use of accessory musculature GI: No organomegaly, abdomen is soft and non-tender, positive bowel sounds.  No masses. Skin: No rashes. Neurologic: Facial musculature symmetric. + Facial sensation intact. 4/5 right vs left UE. 5/5 Kristin Jennings bilaterally. CN 2-12 grossly intact, 2/2 DTRs UE and knees Psychiatric: Patient is appropriate throughout our interaction. Lymphatic: No cervical lymphadenopathy Musculoskeletal: In wheelchair Decreased ROM in neck with lateral rotation due to pain Flexion nl Extension is decreased due to  pain Right shoulder and left shoulder decrease ROM due to pain Sensation intact   LABS: Results for orders placed in visit on 02/21/13  POCT CBC      Result Value Range   WBC 5.9  4.6 - 10.2 K/uL   Lymph, poc 2.1  0.6 - 3.4   POC LYMPH PERCENT 34.9  10 - 50 %L   MID (cbc) 0.6  0 - 0.9   POC MID % 9.4  0 - 12 %M   POC Granulocyte 3.3  2 - 6.9   Granulocyte percent 55.7  37 - 80 %G   RBC 3.82 (*) 4.04 - 5.48 M/uL   Hemoglobin 9.2 (*) 12.2 - 16.2 g/dL   HCT, POC 45.4 (*) 09.8 - 47.9 %   MCV 81.7  80 - 97 fL   MCH, POC 24.1 (*) 27 - 31.2 pg   MCHC 29.5 (*) 31.8 - 35.4 g/dL   RDW, POC 11.9     Platelet Count, POC 451 (*) 142 - 424 K/uL   MPV 6.8  0 - 99.8 fL  IFOBT (OCCULT BLOOD)      Result Value Range   IFOBT Negative       EKG/XRAY:   Primary read interpreted by Dr. Conley Rolls at [facility]. + DJD, no fractures/dislocations   ASSESSMENT/PLAN: Encounter Diagnoses  Name Primary?  . Neck pain Yes  . Weakness   . Slurred speech   . Arthritis   . Anemia    Kristin Jennings is a pleasant 79 y/o AA woman who is here with her daughter with a series of issues as listed above:  1. Most of her neck aches and pains are msk in origin, she has arthritis/DJD on xray. Rx Voltaren gel 2. UA and cx if positive are pending when patient's daughter brings urine specimen back 3. D/w patient about stroke like sxs and whether they want to get a CT scan. Patient does not want a CT scan, does not want to go into the tunnel.  She has stroke risk factors ie HTN, Afib but does not want CT scan We discussed alternatives, risk and benefits, my only concern is if she has a hemorragic stroke since on ASA 81 mg BID with a decreasing HgB and negative IFOBT 4. Anemia-hemosure negative, will await iron studies. Patient has never had colonoscopy and Hgb 2 months ago was 11.   F/u if worsening sxs here or go to ER    Kristin Pascucci PHUONG, DO 02/21/2013 12:19 PM

## 2013-02-22 LAB — IRON AND TIBC
Iron: <10 ug/dL — ABNORMAL LOW (ref 42–145)
UIBC: 202 ug/dL (ref 125–400)

## 2013-02-22 LAB — POCT URINALYSIS DIPSTICK
Bilirubin, UA: NEGATIVE
Blood, UA: NEGATIVE
Glucose, UA: NEGATIVE
Ketones, UA: NEGATIVE
Leukocytes, UA: NEGATIVE
Nitrite, UA: NEGATIVE
Protein, UA: 30
Spec Grav, UA: 1.02
Urobilinogen, UA: 4
pH, UA: 6.5

## 2013-02-22 LAB — POCT UA - MICROSCOPIC ONLY
Bacteria, U Microscopic: NEGATIVE
Casts, Ur, LPF, POC: NEGATIVE
Crystals, Ur, HPF, POC: NEGATIVE
Mucus, UA: NEGATIVE
RBC, urine, microscopic: NEGATIVE
Yeast, UA: NEGATIVE

## 2013-02-22 LAB — FERRITIN: Ferritin: 277 ng/mL (ref 10–291)

## 2013-02-24 ENCOUNTER — Telehealth: Payer: Self-pay

## 2013-02-24 NOTE — Telephone Encounter (Signed)
Spoke with patient about lab results. She has iron def anemia. Advise to take otc iron 325 mg  BID x 1 month and f/u for repeat CBC. Monitor for constipation, take stool softener. Urine test was negative. She is doing fine since the last time we saw each other in office, no confusion.

## 2013-02-24 NOTE — Telephone Encounter (Signed)
Patient's daughter, Elson Clan, called in for Dr. Conley Rolls about a sample that she brought in.  She would like for someone to give her a call back at [phone].

## 2013-02-25 NOTE — Telephone Encounter (Signed)
Thanks I called her as well to let her know to come in 1 month for repeat labs.

## 2013-03-06 ENCOUNTER — Other Ambulatory Visit: Payer: Self-pay | Admitting: Family Medicine

## 2013-03-25 ENCOUNTER — Ambulatory Visit: Payer: Medicare Other

## 2013-03-25 ENCOUNTER — Ambulatory Visit (INDEPENDENT_AMBULATORY_CARE_PROVIDER_SITE_OTHER): Payer: Medicare Other | Admitting: Family Medicine

## 2013-03-25 ENCOUNTER — Ambulatory Visit
Admission: RE | Admit: 2013-03-25 | Discharge: 2013-03-25 | Disposition: A | Discharge status: Home / Self Care | Payer: Medicare Other | Source: Ambulatory Visit | Attending: Family Medicine | Admitting: Family Medicine

## 2013-03-25 VITALS — BP 108/52 | HR 54 | Temp 97.3°F | Resp 16 | Ht 65.0 in | Wt 98.0 lb

## 2013-03-25 DIAGNOSIS — R634 Abnormal weight loss: Secondary | ICD-10-CM

## 2013-03-25 DIAGNOSIS — R918 Other nonspecific abnormal finding of lung field: Secondary | ICD-10-CM

## 2013-03-25 DIAGNOSIS — R9389 Abnormal findings on diagnostic imaging of other specified body structures: Secondary | ICD-10-CM

## 2013-03-25 DIAGNOSIS — D649 Anemia, unspecified: Secondary | ICD-10-CM

## 2013-03-25 LAB — POCT CBC
Granulocyte percent: 63.6 %G (ref 37–80)
HCT, POC: 31.3 % — AB (ref 37.7–47.9)
Hemoglobin: 9 g/dL — AB (ref 12.2–16.2)
Lymph, poc: 2.6 (ref 0.6–3.4)
MCH, POC: 22.4 pg — AB (ref 27–31.2)
MCHC: 28.8 g/dL — AB (ref 31.8–35.4)
MCV: 77.9 fL — AB (ref 80–97)
MID (cbc): 0.7 (ref 0–0.9)
MPV: 7.7 fL (ref 0–99.8)
POC Granulocyte: 5.7 (ref 2–6.9)
POC LYMPH PERCENT: 28.8 %L (ref 10–50)
POC MID %: 7.6 %M (ref 0–12)
Platelet Count, POC: 399 10*3/uL (ref 142–424)
RBC: 4.02 M/uL — AB (ref 4.04–5.48)
RDW, POC: 21 %
WBC: 9 10*3/uL (ref 4.6–10.2)

## 2013-03-25 LAB — TSH: TSH: 1.828 u[IU]/mL (ref 0.350–4.500)

## 2013-03-25 MED ORDER — IOHEXOL 300 MG/ML  SOLN
40.0000 mL | Freq: Once | INTRAMUSCULAR | Status: AC | PRN
  Administered 2013-03-25: 40 mL via INTRAVENOUS

## 2013-03-25 MED ORDER — IOHEXOL 300 MG/ML  SOLN
89.0000 mL | Freq: Once | INTRAMUSCULAR | Status: AC | PRN
  Administered 2013-03-25: 89 mL via INTRAVENOUS
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
## 2013-03-25 NOTE — Addendum Note (Signed)
Addended byCaffie Damme on: 03/25/2013 03:11 PM   Modules accepted: Orders

## 2013-03-25 NOTE — Patient Instructions (Addendum)
Drink contrast #1 at 12 noon contrast #2 at 1pm arrive at [facility] imaging for your scan at 2pm    Driving directions to [address] [place], [place], [place] [address] [id number]  - more info    [address]  [place], [place] [address]     1. Head south on [address] toward [address]      0.5 mi    2. Sharp left onto [address]      0.6 mi    3. Turn left onto the [address] ramp      0.2 mi    4. Merge onto [address]      3.0 mi    5. Continue straight to stay on [address]      0.4 mi    6. Slight left to stay on [facility] [address] [facility]  Destination will be on the right     1.0 mi     [address] [place], [place] [address]

## 2013-03-26 ENCOUNTER — Telehealth: Payer: Self-pay

## 2013-03-26 LAB — COMPREHENSIVE METABOLIC PANEL
ALT: <8 U/L (ref 0–35)
AST: 9 U/L (ref 0–37)
Albumin: 3.1 g/dL — ABNORMAL LOW (ref 3.5–5.2)
Alkaline Phosphatase: 77 U/L (ref 39–117)
BUN: 21 mg/dL (ref 6–23)
CO2: 24 mEq/L (ref 19–32)
Calcium: 9.5 mg/dL (ref 8.4–10.5)
Chloride: 96 mEq/L (ref 96–112)
Creat: 0.76 mg/dL (ref 0.50–1.10)
Glucose, Bld: 88 mg/dL (ref 70–99)
Potassium: 4.6 mEq/L (ref 3.5–5.3)
Sodium: 132 mEq/L — ABNORMAL LOW (ref 135–145)
Total Bilirubin: 0.4 mg/dL (ref 0.3–1.2)
Total Protein: 8.7 g/dL — ABNORMAL HIGH (ref 6.0–8.3)

## 2013-03-26 NOTE — Telephone Encounter (Signed)
Dr Milus Glazier, daughter Neysa Bonito called again to see if results are in because she is worried. I advised her that they have not been reviewed yet but that you will be in this evening. Please review labs and daughter can be reached at her home tonight after 6 pm [phone].

## 2013-03-26 NOTE — Telephone Encounter (Signed)
Tests show no cancer.  I want her to try taking Ensure milkshakes qid and recheck in 1 week.

## 2013-03-26 NOTE — Telephone Encounter (Signed)
Please advise 

## 2013-03-26 NOTE — Telephone Encounter (Signed)
Patient's daughter is calling to get her mother's lab results. [phone]

## 2013-03-27 ENCOUNTER — Telehealth: Payer: Self-pay

## 2013-03-27 NOTE — Telephone Encounter (Signed)
Called daughter Neysa Bonito to advise. Left message to advise.

## 2013-03-27 NOTE — Telephone Encounter (Signed)
Patient daughter calling for lab results. Please call back at [phone] [facility].

## 2013-03-27 NOTE — Telephone Encounter (Signed)
I called her daughter and left message about scans. She should follow up with Dr Milus Glazier in 1 week

## 2013-03-31 ENCOUNTER — Ambulatory Visit (INDEPENDENT_AMBULATORY_CARE_PROVIDER_SITE_OTHER): Payer: Medicare Other | Admitting: Family Medicine

## 2013-03-31 VITALS — BP 110/60 | HR 70 | Temp 98.0°F | Resp 16 | Ht 64.5 in | Wt 97.0 lb

## 2013-03-31 DIAGNOSIS — L608 Other nail disorders: Secondary | ICD-10-CM

## 2013-03-31 DIAGNOSIS — D649 Anemia, unspecified: Secondary | ICD-10-CM

## 2013-03-31 DIAGNOSIS — T7431XA Adult psychological abuse, confirmed, initial encounter: Secondary | ICD-10-CM

## 2013-03-31 DIAGNOSIS — R634 Abnormal weight loss: Secondary | ICD-10-CM

## 2013-03-31 DIAGNOSIS — T7491XA Unspecified adult maltreatment, confirmed, initial encounter: Secondary | ICD-10-CM

## 2013-03-31 LAB — POCT CBC
Granulocyte percent: 68.2 %G (ref 37–80)
HCT, POC: 27.6 % — AB (ref 37.7–47.9)
Hemoglobin: 8.3 g/dL — AB (ref 12.2–16.2)
Lymph, poc: 2 (ref 0.6–3.4)
MCH, POC: 22.6 pg — AB (ref 27–31.2)
MCHC: 30.1 g/dL — AB (ref 31.8–35.4)
MCV: 75.1 fL — AB (ref 80–97)
MID (cbc): 0.7 (ref 0–0.9)
MPV: 6.9 fL (ref 0–99.8)
POC Granulocyte: 5.9 (ref 2–6.9)
POC LYMPH PERCENT: 23.5 %L (ref 10–50)
POC MID %: 8.3 %M (ref 0–12)
Platelet Count, POC: 413 10*3/uL (ref 142–424)
RBC: 3.68 M/uL — AB (ref 4.04–5.48)
RDW, POC: 20.9 %
WBC: 8.7 10*3/uL (ref 4.6–10.2)

## 2013-03-31 LAB — TSH: TSH: 5.154 u[IU]/mL — ABNORMAL HIGH (ref 0.350–4.500)

## 2013-03-31 LAB — T4, FREE: Free T4: 1.36 ng/dL (ref 0.80–1.80)
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
## 2013-03-31 NOTE — Progress Notes (Signed)
Please contact daughter Wynona Canes at [phone] for all referrals set up today- Child psychotherapist, Oncology, Statistician. Eileen Stanford

## 2013-04-07 ENCOUNTER — Telehealth: Payer: Self-pay
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
## 2013-04-07 NOTE — Telephone Encounter (Signed)
Kristin Jennings STATES DR Kenyon Ana WAS SENDING SOMEONE FROM [facility] TO INVESTIGATE HER. PLEASE CALL [phone]

## 2013-04-07 NOTE — Telephone Encounter (Signed)
Please advise, what do you want me to advise, is this a safety eval?

## 2013-04-08 NOTE — Telephone Encounter (Signed)
Thanks, I have called and left message for Wynona Canes to call me back.

## 2013-04-08 NOTE — Telephone Encounter (Signed)
Advised patient's daughter.

## 2013-04-15 ENCOUNTER — Telehealth: Payer: Self-pay | Admitting: Hematology and Oncology

## 2013-04-15 NOTE — Telephone Encounter (Signed)
PT DTR CALLED BACK TO SCHEDULE NP APPT 07/28 @ 10:30 W/DR. ROSTAPHOV DX- ANEMA WELCOME PACKET MAILED.

## 2013-04-15 NOTE — Telephone Encounter (Signed)
CALLED AND S/W PT DTR PER PT DTR SHE WAS NOT AWARE OF REFERRAL WILL CALL BACK AFTER SPEAKING W/MD ON TODAY.

## 2013-04-21 ENCOUNTER — Ambulatory Visit: Payer: Medicare Other

## 2013-04-21 ENCOUNTER — Ambulatory Visit (HOSPITAL_BASED_OUTPATIENT_CLINIC_OR_DEPARTMENT_OTHER): Payer: Medicare Other | Admitting: Hematology and Oncology

## 2013-04-21 ENCOUNTER — Telehealth: Payer: Self-pay | Admitting: Hematology and Oncology

## 2013-04-21 ENCOUNTER — Telehealth: Payer: Self-pay

## 2013-04-21 ENCOUNTER — Encounter: Payer: Self-pay | Admitting: Hematology and Oncology

## 2013-04-21 ENCOUNTER — Ambulatory Visit (HOSPITAL_BASED_OUTPATIENT_CLINIC_OR_DEPARTMENT_OTHER): Payer: Medicare Other | Admitting: Lab

## 2013-04-21 ENCOUNTER — Other Ambulatory Visit: Payer: Medicare Other | Admitting: Lab

## 2013-04-21 VITALS — BP 116/64 | HR 58 | Temp 97.0°F | Resp 18 | Ht 64.0 in | Wt 93.6 lb

## 2013-04-21 DIAGNOSIS — D649 Anemia, unspecified: Secondary | ICD-10-CM

## 2013-04-21 DIAGNOSIS — D509 Iron deficiency anemia, unspecified: Secondary | ICD-10-CM

## 2013-04-21 DIAGNOSIS — I10 Essential (primary) hypertension: Secondary | ICD-10-CM

## 2013-04-21 LAB — FERRITIN CHCC: Ferritin: 287 ng/ml — ABNORMAL HIGH (ref 9–269)

## 2013-04-21 LAB — IRON AND TIBC CHCC: Iron: <5 ug/dL — ABNORMAL LOW (ref 41–142)

## 2013-04-21 NOTE — Progress Notes (Signed)
ID: Kristin Jennings OB: 08-28-1916  MR#: [id number]  CSN#:[id number]  PCP: Janace Hoard, MD   HISTORY OF PRESENT ILLNESS:  The patient is a 79 y.o. female who was send to [place] because of anemia. She was started on ferrous sulfate orally but did not had work up for her anemia according to her and family. She told that she lost 20 lb in 1 month. Kristin Jennings does not has appetite, feels tired and cold. She reported occasionally dry cough. The patient has low back pain, hip and knee pain and tingling in legs.  REVIEW OF SYSTEMS:  The patient denied fever, chills, night sweats. She has poor appetite and lost weight.She always feels tired and cold. Her hearing is diminished. She denied headaches, double vision, blurry vision, nasal congestion, nasal discharge,, odynophagia or dysphagia. No chest pain, palpitations, dyspnea, occasionally dry cough. No abdominal pain, nausea, vomiting, diarrhea, constipation, hematochezia. The patient denied dysuria, , polyuria, hematuria, myalgia, numbness,psychiatric problems. She reported nocturia (wakes up 4 times to urinate).The patient has pain in lower back, hip, knees and tingling in legs.  PAST MEDICAL HISTORY: Past Medical History  Diagnosis Date  . Arthritis   . Hypertension   . Atrial fibrillation   . Thyroid disease     PAST SURGICAL HISTORY: No past surgical history on file.  FAMILY HISTORY No family history on file.  HEALTH MAINTENANCE: History  Substance Use Topics  . Smoking status: Never Smoker   . Smokeless tobacco: Not on file  . Alcohol Use: No    Allergies  Allergen Reactions  . Lisinopril Cough    Current Outpatient Prescriptions  Medication Sig Dispense Refill  . amLODipine (NORVASC) 5 MG tablet TAKE 1 TABLET BY MOUTH EVERY DAY  30 tablet  2  . aspirin 81 MG tablet Take 81 mg by mouth 2 (two) times daily.      . diclofenac sodium (VOLTAREN) 1 % GEL Apply 2 g topically 3 (three) times daily as needed.  1 Tube  0  .  Ferrous Sulfate (IRON) 325 (65 FE) MG TABS Take 325 mg by mouth 2 (two) times daily.      . hydrALAZINE (APRESOLINE) 10 MG tablet TAKE 1 TABLET BY MOUTH TWICE A DAY FOR CIRCULATION  60 tablet  2  . hydrochlorothiazide (MICROZIDE) 12.5 MG capsule Take 1 capsule (12.5 mg total) by mouth daily.  30 capsule  5  . levothyroxine (SYNTHROID) 75 MCG tablet Take 1 tablet (75 mcg total) by mouth daily.  90 tablet  1   No current facility-administered medications for this visit.    OBJECTIVE: Filed Vitals:   04/21/13 1104  BP: 116/64  Pulse: 58  Temp: 97 F (36.1 C)  Resp: 18     Body mass index is 16.06 kg/(m^2).    ECOG FS:  HEENT: Sclerae anicteric.  Conjunctivae were pink. Pupils round and reactive bilaterally. Oral mucosa is moist without ulceration or thrush. No occipital, submandibular, cervical, supraclavicular or axillar adenopathy. Lungs: clear to auscultation without wheezes. No rales or rhonchi. Heart: regular rate and rhythm. No murmur, gallop or rubs. Abdomen: soft, non tender. No guarding or rebound tenderness. Bowel sounds are present. No palpable hepatosplenomegaly. MSK: no focal spinal tenderness. Extremities: No clubbing or cyanosis.No calf tenderness to palpitation, no peripheral edema. The patient had grossly intact strength in upper and lower extremities Neuro: non-focal, alert and oriented to time, person and place, appropriate affect Breasts:  Negative examination.   LAB RESULTS:  CMP  Component Value Date/Time   NA 132* 03/25/2013 1139   K 4.6 03/25/2013 1139   CL 96 03/25/2013 1139   CO2 24 03/25/2013 1139   GLUCOSE 88 03/25/2013 1139   BUN 21 03/25/2013 1139   CREATININE 0.76 03/25/2013 1139   CALCIUM 9.5 03/25/2013 1139   PROT 8.7* 03/25/2013 1139   ALBUMIN 3.1* 03/25/2013 1139   AST 9 03/25/2013 1139   ALT <8 03/25/2013 1139   ALKPHOS 77 03/25/2013 1139   BILITOT 0.4 03/25/2013 1139    Lab Results  Component Value Date   WBC 8.7 03/31/2013   HGB 8.3* 03/31/2013   HCT 27.6*  03/31/2013   MCV 75.1* 03/31/2013      Chemistry      Component Value Date/Time   NA 132* 03/25/2013 1139   K 4.6 03/25/2013 1139   CL 96 03/25/2013 1139   CO2 24 03/25/2013 1139   BUN 21 03/25/2013 1139   CREATININE 0.76 03/25/2013 1139      Component Value Date/Time   CALCIUM 9.5 03/25/2013 1139   ALKPHOS 77 03/25/2013 1139   AST 9 03/25/2013 1139   ALT <8 03/25/2013 1139   BILITOT 0.4 03/25/2013 1139      Urinalysis    Component Value Date/Time   BILIRUBINUR neg 02/22/2013 1725   UROBILINOGEN 4.0 02/22/2013 1725   NITRITE neg 02/22/2013 1725   LEUKOCYTESUR Negative 02/22/2013 1725    STUDIES: Dg Chest 2 View  03/25/2013   *RADIOLOGY REPORT*  Clinical Data: Weight loss  CHEST - 2 VIEW  Comparison: None.  Findings: The heart and pulmonary vascularity are within normal limits.  The lungs are hyperexpanded consistent with COPD.  In the right upper lobe there is a somewhat nodular area of increased density identified which measures at least 2.9 x 2.9 cm.  This is highly suspicious for underlying pulmonary neoplasm  IMPRESSION: Changes suspicious for right upper lobe mass.  CT of the chest with contrast is recommended for further evaluation.  Clinically significant discrepancy from primary report, if provided: None   Original Report Authenticated By: Alcide Clever, M.D.   Ct Chest W Contrast  03/25/2013   *RADIOLOGY REPORT*  Clinical Data: Questionable right upper lobe lesion on CT chest,  CT CHEST WITH CONTRAST  Technique:  Multidetector CT imaging of the chest was performed following the standard protocol during bolus administration of intravenous contrast.  Contrast: 40mL OMNIPAQUE IOHEXOL 300 MG/ML  SOLN  Comparison: Chest x-ray of 03/25/2013 and CT chest of 12/22/2008  Findings: On the lung window images, biapical pleuroparenchymal scarring some which are calcified appears stable compared to the CT from 2010.  No new apical lung lesion is seen.  Small scattered lung nodules again are noted throughout the lungs  all of which appear stable and most consistent with prior infection. With a small calcified granuloma in the left lower lobe, these changes may be due to prior granulomatous disease.  No suspicious lung mass is seen.  There is a vague area of ground-glass opacity within the right lower lobe which is only increased slightly in size in the intervening 4 years, and this age patient is of doubtful clinical significance.  No active infiltrate or effusion is seen.  The lungs remain hyperaerated.  The central airway is patent.  On soft tissue window images, the thyroid gland is unremarkable. The thoracic aorta opacifies with no significant abnormality noted. The pulmonary arteries opacify as well with no acute abnormality. No mediastinal or hilar adenopathy is seen.  Very little coronary artery calcification is present, and mild cardiomegaly is stable. The bones are osteopenic and there is a moderate thoracic kyphosis present.  No compression deformity is seen and no evidence of bony metastatic disease is evident.  IMPRESSION:  1.  There are multiple noncalcified pulmonary nodules bilaterally, all of which appear to be stable compared with CT from 2010, consistent with prior inflammatory process, possibly granulomatous with a calcified granuloma present within the left lower lobe. 2.  No pulmonary mass is seen and no mediastinal or hilar adenopathy is noted. 3.  Stable biapical pleuroparenchymal scarring with some calcification.  No right upper lobe lung nodule is seen as was questioned on recent chest x-ray. 4.  Cardiomegaly. 5.  Moderate thoracic kyphosis with osteopenia.   Original Report Authenticated By: Dwyane Dee, M.D.   Ct Abdomen Pelvis W Contrast  03/25/2013   *RADIOLOGY REPORT*  Clinical Data: Weight loss of 20 pounds and less than 2 months  CT ABDOMEN AND PELVIS WITH CONTRAST  Technique:  Multidetector CT imaging of the abdomen and pelvis was performed following the standard protocol during bolus  administration of intravenous contrast.  Contrast: 89mL OMNIPAQUE IOHEXOL 300 MG/ML  SOLN  Comparison: None.  Findings: There are several noncalcified pulmonary nodules present, with the largest in the left lower lobe subpleural in location measuring 7 mm in diameter.  These nodules are worrisome for possible metastatic involvement of the lung bases.  Cardiomegaly is noted.  There are multiple low attenuation structures scattered throughout the liver most consistent with benign process such as cysts.  A few of these lesions however particularly in the caudal right lobe are indistinct and metastatic involvement of the liver cannot be excluded.  No calcified gallstones are seen.  The pancreas is unremarkable with slight prominence of the pancreatic duct noted. The adrenal glands and spleen appear unremarkable.  The stomach is not well distended and cannot be evaluated.  The kidneys enhance with no evidence of solid mass, with a cyst emanating from the lower pole of the left kidney.  On delayed images, the pelvocaliceal systems are unremarkable in the proximal ureters are normal in caliber.  The abdominal aorta is normal in caliber.  No adenopathy is seen.  The urinary bladder is not well distended and cannot be evaluated. The uterus appears atrophic.  No adnexal lesion is seen.  No fluid is noted within the pelvis.  There is feces throughout the colon but no gross constricting lesion is seen.  The terminal ileum is unremarkable.  The appendix is not definitely seen.  The bones are very osteopenic.  There are diffuse degenerative changes throughout the lumbar spine with exaggerated lumbar scoliosis noted.  IMPRESSION:  1.  Small noncalcified nodules at the lung bases are suspicious for metastatic involvement of the lungs.  CT of the chest would be helpful to assess further. 2.  No abdominal or pelvic mass or adenopathy. 3.  Multiple low attenuation lesions scattered throughout the liver most consistent with cysts.    Original Report Authenticated By: Dwyane Dee, M.D.    ASSESSMENT: 79 y.o. was sent for evaluation of anemia. Stable pulmonary nodules since 2010. Weight loss.  PLAN:  1. I suspect iron deficiency anemia or anemia or chronic disease. Iron panel will be ordered. 2. Stable pulmonary nodules can be follow. 3. In a view of weight loss would consider colonoscopy if patient will consider any treatment in future. 4. Follow up in 5-7 days.  Spended time with patient 45 min.  Myra Rude, MD   04/21/2013 6:24 PM

## 2013-04-21 NOTE — Progress Notes (Signed)
Checked in new pt with no financial concerns. °

## 2013-04-21 NOTE — Telephone Encounter (Signed)
Pt family member is wanting to know if patient should still be on a certain medication   Best number is [phone]   Not sure about hippa

## 2013-04-21 NOTE — Telephone Encounter (Signed)
gv and printed appt sched and avs for pt,,,sent pt back to lab

## 2013-04-22 NOTE — Telephone Encounter (Signed)
Called her, she was very hard to understand, wants to know if Ms Chism needs HCTZ, please advise.

## 2013-04-26 MED ORDER — HYDROCHLOROTHIAZIDE 12.5 MG PO CAPS: 12.5000 mg | ORAL_CAPSULE | Freq: Every day | ORAL | Status: DC

## 2013-04-26 NOTE — Telephone Encounter (Signed)
Please advise the patient the Rx was sent.  Meds ordered this encounter  Medications  . hydrochlorothiazide (MICROZIDE) 12.5 MG capsule    Sig: Take 1 capsule (12.5 mg total) by mouth daily.    Dispense:  30 capsule    Refill:  5

## 2013-04-26 NOTE — Telephone Encounter (Signed)
Yes,  refill 

## 2013-04-27 ENCOUNTER — Telehealth: Payer: Self-pay

## 2013-04-27 NOTE — Telephone Encounter (Signed)
Left message to return call. Need to notify RX set in

## 2013-04-27 NOTE — Telephone Encounter (Signed)
Calling us back from last night --please call back at [phone]

## 2013-04-28 NOTE — Telephone Encounter (Signed)
Rx was sent in. Patient does need to continue HCTZ called again.

## 2013-04-29 ENCOUNTER — Telehealth: Payer: Self-pay | Admitting: Hematology and Oncology

## 2013-04-29 ENCOUNTER — Ambulatory Visit (HOSPITAL_BASED_OUTPATIENT_CLINIC_OR_DEPARTMENT_OTHER): Payer: Medicare Other | Admitting: Hematology and Oncology

## 2013-04-29 VITALS — BP 109/68 | HR 102 | Temp 97.5°F | Resp 20 | Ht 64.0 in | Wt 95.0 lb

## 2013-04-29 DIAGNOSIS — D509 Iron deficiency anemia, unspecified: Secondary | ICD-10-CM

## 2013-04-29 DIAGNOSIS — D638 Anemia in other chronic diseases classified elsewhere: Secondary | ICD-10-CM

## 2013-04-29 DIAGNOSIS — R5383 Other fatigue: Secondary | ICD-10-CM

## 2013-04-29 DIAGNOSIS — R911 Solitary pulmonary nodule: Secondary | ICD-10-CM

## 2013-04-29 NOTE — Progress Notes (Addendum)
ID: Kristin Jennings Cluster OB: 11-12-1915  MR#: [id number]  CSN#:[id number]  PCP: Janace Hoard, MD GYN:   SU:  OTHER MD:   HISTORY OF PRESENT ILLNESS:  INTERVAL HISTORY:  REVIEW OF SYSTEMS: The patient denied fever, chills, night sweats, change in appetite or weight. He denied headaches, double vision, blurry vision, nasal congestion, nasal discharge, hearing problems, odynophagia or dysphagia. No chest pain, palpitations, dyspnea, cough, abdominal pain, nausea, vomiting, diarrhea, constipation, hematochezia. The patient denied dysuria, nocturia, polyuria, hematuria, myalgia, numbness, tingling, psychiatric problems.  PAST MEDICAL HISTORY: Past Medical History  Diagnosis Date  . Arthritis   . Hypertension   . Atrial fibrillation   . Thyroid disease     PAST SURGICAL HISTORY: No past surgical history on file.  FAMILY HISTORY No family history on file.  GYNECOLOGIC HISTORY:   SOCIAL HISTORY:      ADVANCED DIRECTIVES:    HEALTH MAINTENANCE: History  Substance Use Topics  . Smoking status: Never Smoker   . Smokeless tobacco: Not on file  . Alcohol Use: No     Colonoscopy:  PAP:  Bone density:  Lipid panel:  Allergies  Allergen Reactions  . Lisinopril Cough    Current Outpatient Prescriptions  Medication Sig Dispense Refill  . amLODipine (NORVASC) 5 MG tablet TAKE 1 TABLET BY MOUTH EVERY DAY  30 tablet  2  . aspirin 81 MG tablet Take 81 mg by mouth 2 (two) times daily.      . diclofenac sodium (VOLTAREN) 1 % GEL Apply 2 g topically 3 (three) times daily as needed.  1 Tube  0  . Ferrous Sulfate (IRON) 325 (65 FE) MG TABS Take 325 mg by mouth 2 (two) times daily.      . hydrALAZINE (APRESOLINE) 10 MG tablet TAKE 1 TABLET BY MOUTH TWICE A DAY FOR CIRCULATION  60 tablet  2  . hydrochlorothiazide (MICROZIDE) 12.5 MG capsule Take 1 capsule (12.5 mg total) by mouth daily.  30 capsule  5  . levothyroxine (SYNTHROID) 75 MCG tablet Take 1 tablet (75 mcg total) by mouth  daily.  90 tablet  1   No current facility-administered medications for this visit.    OBJECTIVE: Filed Vitals:   04/29/13 1011  BP: 109/68  Pulse: 102  Temp: 97.5 F (36.4 C)  Resp: 20     Body mass index is 16.3 kg/(m^2).    ECOG FS:  HEENT: Sclerae anicteric.  Conjunctivae were pink. Pupils round and reactive bilaterally. Oral mucosa is moist without ulceration or thrush. No occipital, submandibular, cervical, supraclavicular or axillar adenopathy. Lungs: clear to auscultation without wheezes. No rales or rhonchi. Heart: regular rate and rhythm. No murmur, gallop or rubs. Abdomen: soft, non tender. No guarding or rebound tenderness. Bowel sounds are present. No palpable hepatosplenomegaly. MSK: no focal spinal tenderness. Extremities: No clubbing or cyanosis.No calf tenderness to palpitation, no peripheral edema. The patient had grossly intact strength in upper and lower extremities Neuro: non-focal, alert and oriented to time, person and place, appropriate affect  LAB RESULTS:  CMP     Component Value Date/Time   NA 132* 03/25/2013 1139   K 4.6 03/25/2013 1139   CL 96 03/25/2013 1139   CO2 24 03/25/2013 1139   GLUCOSE 88 03/25/2013 1139   BUN 21 03/25/2013 1139   CREATININE 0.76 03/25/2013 1139   CALCIUM 9.5 03/25/2013 1139   PROT 8.7* 03/25/2013 1139   ALBUMIN 3.1* 03/25/2013 1139   AST 9 03/25/2013 1139   ALT <8 03/25/2013 1139  ALKPHOS 77 03/25/2013 1139   BILITOT 0.4 03/25/2013 1139    I No results found for this basename: SPEP, UPEP,  kappa and lambda light chains    Lab Results  Component Value Date   WBC 8.7 03/31/2013   HGB 8.3* 03/31/2013   HCT 27.6* 03/31/2013   MCV 75.1* 03/31/2013      Chemistry      Component Value Date/Time   NA 132* 03/25/2013 1139   K 4.6 03/25/2013 1139   CL 96 03/25/2013 1139   CO2 24 03/25/2013 1139   BUN 21 03/25/2013 1139   CREATININE 0.76 03/25/2013 1139      Component Value Date/Time   CALCIUM 9.5 03/25/2013 1139   ALKPHOS 77 03/25/2013 1139   AST 9  03/25/2013 1139   ALT <8 03/25/2013 1139   BILITOT 0.4 03/25/2013 1139       No results found for this basename: LABCA2    No components found with this basename: [id number]    No results found for this basename: INR,  in the last 168 hours  Urinalysis    Component Value Date/Time   BILIRUBINUR neg 02/22/2013 1725   UROBILINOGEN 4.0 02/22/2013 1725   NITRITE neg 02/22/2013 1725   LEUKOCYTESUR Negative 02/22/2013 1725    STUDIES: No results found.  ASSESSMENT: 79 y.o.  1. ACD provbably with IDA PLAN:: 1. Increase ferrous sulfate to 4 tabs a day. 2. Recommend screening for neoplasm ( consider colonoscopy)  3 Consider work up for infection, Check for TB. 3 Follow up with CBC and iron panal in 1 month. 4 Follow up with me in 2 months with CBC and iron panel   Myra Rude, MD   04/29/2013 11:05 AM

## 2013-04-29 NOTE — Telephone Encounter (Signed)
gv pt appt schedule for September and October.  °

## 2013-04-29 NOTE — Progress Notes (Signed)
ID: Kristin Jennings OB: 07/09/1916  MR#: [id number]  CSN#:[id number]   OFFICE PROGRESS NOTE  PCP: Kristin Hoard, MD  Diagnosis:   1. Anemia of chronic disease  probably with iron deficiency anemia . 2. Weight loss. 3. Stable pulmonary node. 4. Fatigue.  HISTORY OF PRESENT ILLNESS:  The patient is a 79 y.o. female who was send to [place] because of anemia. She was started on ferrous sulfate orally but did not had work up for her anemia according to her and family. She told that she lost 20 lb in 1 month. Kristin Jennings does not has appetite, feels tired and cold.  I saw her first time on 04/21/2013. I suspect iron deficiency anemia or anemia or chronic disease and ordered iron panel. In a view of weight loss would consider colonoscopy if patient will consider any treatment in future.  It was reported that patient has stable pulmonary nodules which can be follow.   INTERVAL HISTORY: The patient is a 79 y.o. female who presented for follow up visit. She does not has appetite, feels tired and cold. She reported occasionally dry cough. The patient has low back pain, hip and knee pain and tingling in legs. The patient denied fever, chills. She denied headaches, double vision, blurry vision, nasal congestion, nasal discharge, hearing problems, odynophagia or dysphagia. No chest pain, palpitations, dyspnea,  abdominal pain, nausea, vomiting, diarrhea, constipation, hematochezia. The patient denied dysuria, nocturia, polyuria, hematuria, myalgia, numbness, tingling, psychiatric problems.  Review of Systems  Constitutional: Positive for weight loss and malaise/fatigue. Negative for fever, chills and diaphoresis.  HENT: Negative for hearing loss, ear pain, nosebleeds, congestion, sore throat, neck pain and tinnitus.   Eyes: Negative for blurred vision, double vision, photophobia and pain.  Respiratory: Negative for cough, hemoptysis, sputum production, shortness of breath, wheezing and stridor.    Cardiovascular: Negative for chest pain, palpitations, orthopnea, claudication, leg swelling and PND.  Gastrointestinal: Negative for heartburn, nausea, vomiting, abdominal pain, diarrhea, constipation, blood in stool and melena.  Genitourinary: Negative for dysuria, urgency, frequency, hematuria and flank pain.  Musculoskeletal: Positive for back pain and joint pain. Negative for myalgias.  Skin: Negative for itching and rash.  Neurological: Positive for tingling. Negative for dizziness, tremors, focal weakness, seizures, loss of consciousness, weakness and headaches.  Endo/Heme/Allergies: Negative for polydipsia. Does not bruise/bleed easily.  Psychiatric/Behavioral: Negative.     PAST MEDICAL HISTORY: Past Medical History  Diagnosis Date  . Arthritis   . Hypertension   . Atrial fibrillation   . Thyroid disease     PAST SURGICAL HISTORY: No past surgical history on file.  FAMILY HISTORY No family history on file.  HEALTH MAINTENANCE: History  Substance Use Topics  . Smoking status: Never Smoker   . Smokeless tobacco: Not on file  . Alcohol Use: No    Allergies  Allergen Reactions  . Lisinopril Cough    Current Outpatient Prescriptions  Medication Sig Dispense Refill  . amLODipine (NORVASC) 5 MG tablet TAKE 1 TABLET BY MOUTH EVERY DAY  30 tablet  2  . aspirin 81 MG tablet Take 81 mg by mouth 2 (two) times daily.      . diclofenac sodium (VOLTAREN) 1 % GEL Apply 2 g topically 3 (three) times daily as needed.  1 Tube  0  . Ferrous Sulfate (IRON) 325 (65 FE) MG TABS Take 325 mg by mouth 2 (two) times daily.      . hydrALAZINE (APRESOLINE) 10 MG tablet TAKE 1 TABLET BY MOUTH TWICE A  DAY FOR CIRCULATION  60 tablet  2  . hydrochlorothiazide (MICROZIDE) 12.5 MG capsule Take 1 capsule (12.5 mg total) by mouth daily.  30 capsule  5  . levothyroxine (SYNTHROID) 75 MCG tablet Take 1 tablet (75 mcg total) by mouth daily.  90 tablet  1   No current facility-administered  medications for this visit.    OBJECTIVE: Filed Vitals:   04/29/13 1011  BP: 109/68  Pulse: 102  Temp: 97.5 F (36.4 C)  Resp: 20     Body mass index is 16.3 kg/(m^2).    ECOG FS:  HEENT: Sclerae anicteric.  Conjunctivae were pink. Pupils round and reactive bilaterally. Oral mucosa is moist without ulceration or thrush. No occipital, submandibular, cervical, supraclavicular or axillar adenopathy. Lungs: clear to auscultation without wheezes. No rales or rhonchi. Heart: regular rate and rhythm. No murmur, gallop or rubs. Abdomen: soft, non tender. No guarding or rebound tenderness. Bowel sounds are present. No palpable hepatosplenomegaly. MSK: no focal spinal tenderness. Extremities: No clubbing or cyanosis.No calf tenderness to palpitation, no peripheral edema. The patient had grossly intact strength in upper and lower extremities Neuro: non-focal, alert and oriented to time, person and place, appropriate affect  LAB RESULTS:  CMP     Component Value Date/Time   NA 132* 03/25/2013 1139   K 4.6 03/25/2013 1139   CL 96 03/25/2013 1139   CO2 24 03/25/2013 1139   GLUCOSE 88 03/25/2013 1139   BUN 21 03/25/2013 1139   CREATININE 0.76 03/25/2013 1139   CALCIUM 9.5 03/25/2013 1139   PROT 8.7* 03/25/2013 1139   ALBUMIN 3.1* 03/25/2013 1139   AST 9 03/25/2013 1139   ALT <8 03/25/2013 1139   ALKPHOS 77 03/25/2013 1139   BILITOT 0.4 03/25/2013 1139    Lab Results  Component Value Date   WBC 8.7 03/31/2013   HGB 8.3* 03/31/2013   HCT 27.6* 03/31/2013   MCV 75.1* 03/31/2013      Chemistry      Component Value Date/Time   NA 132* 03/25/2013 1139   K 4.6 03/25/2013 1139   CL 96 03/25/2013 1139   CO2 24 03/25/2013 1139   BUN 21 03/25/2013 1139   CREATININE 0.76 03/25/2013 1139      Component Value Date/Time   CALCIUM 9.5 03/25/2013 1139   ALKPHOS 77 03/25/2013 1139   AST 9 03/25/2013 1139   ALT <8 03/25/2013 1139   BILITOT 0.4 03/25/2013 1139      Urinalysis    Component Value Date/Time   BILIRUBINUR neg 02/22/2013 1725    UROBILINOGEN 4.0 02/22/2013 1725   NITRITE neg 02/22/2013 1725   LEUKOCYTESUR Negative 02/22/2013 1725    ASSESSMENT:  1. Anemia of chronic disease  probably with iron deficiency anemia . 2. Weight loss. 3. Stable pulmonary node. 4. Fatigue.  PLAN: 1. Increase ferrous sulfate to 4 tabs a day. 2. Recommend screening for neoplasm ( consider colonoscopy)   3 Consider work up for infection, Check for TB. 4 Follow up with CBC and iron panel in 1 month. 5 Follow up with me in 2 months with CBC and iron panel.  Spended time with patient 25 min.   Myra Rude, MD   04/29/2013 7:21 PM

## 2013-04-30 NOTE — Telephone Encounter (Signed)
Advised granddaughter/caretaker that we had sent in RFs and Dr L does want pt to cont HCTZ. Granddaughter reported that she has p/up Rx and is giving it to pt.

## 2013-05-02 IMAGING — CR DG SHOULDER 2+V*R*
2 series · 2 of 2 positions shown · non-contrast
Comparison: 02/26/2012.

CLINICAL DATA: Right shoulder pain for the past week.  No known
injury.

RIGHT SHOULDER - 2+ VIEW

[ap ext rot]
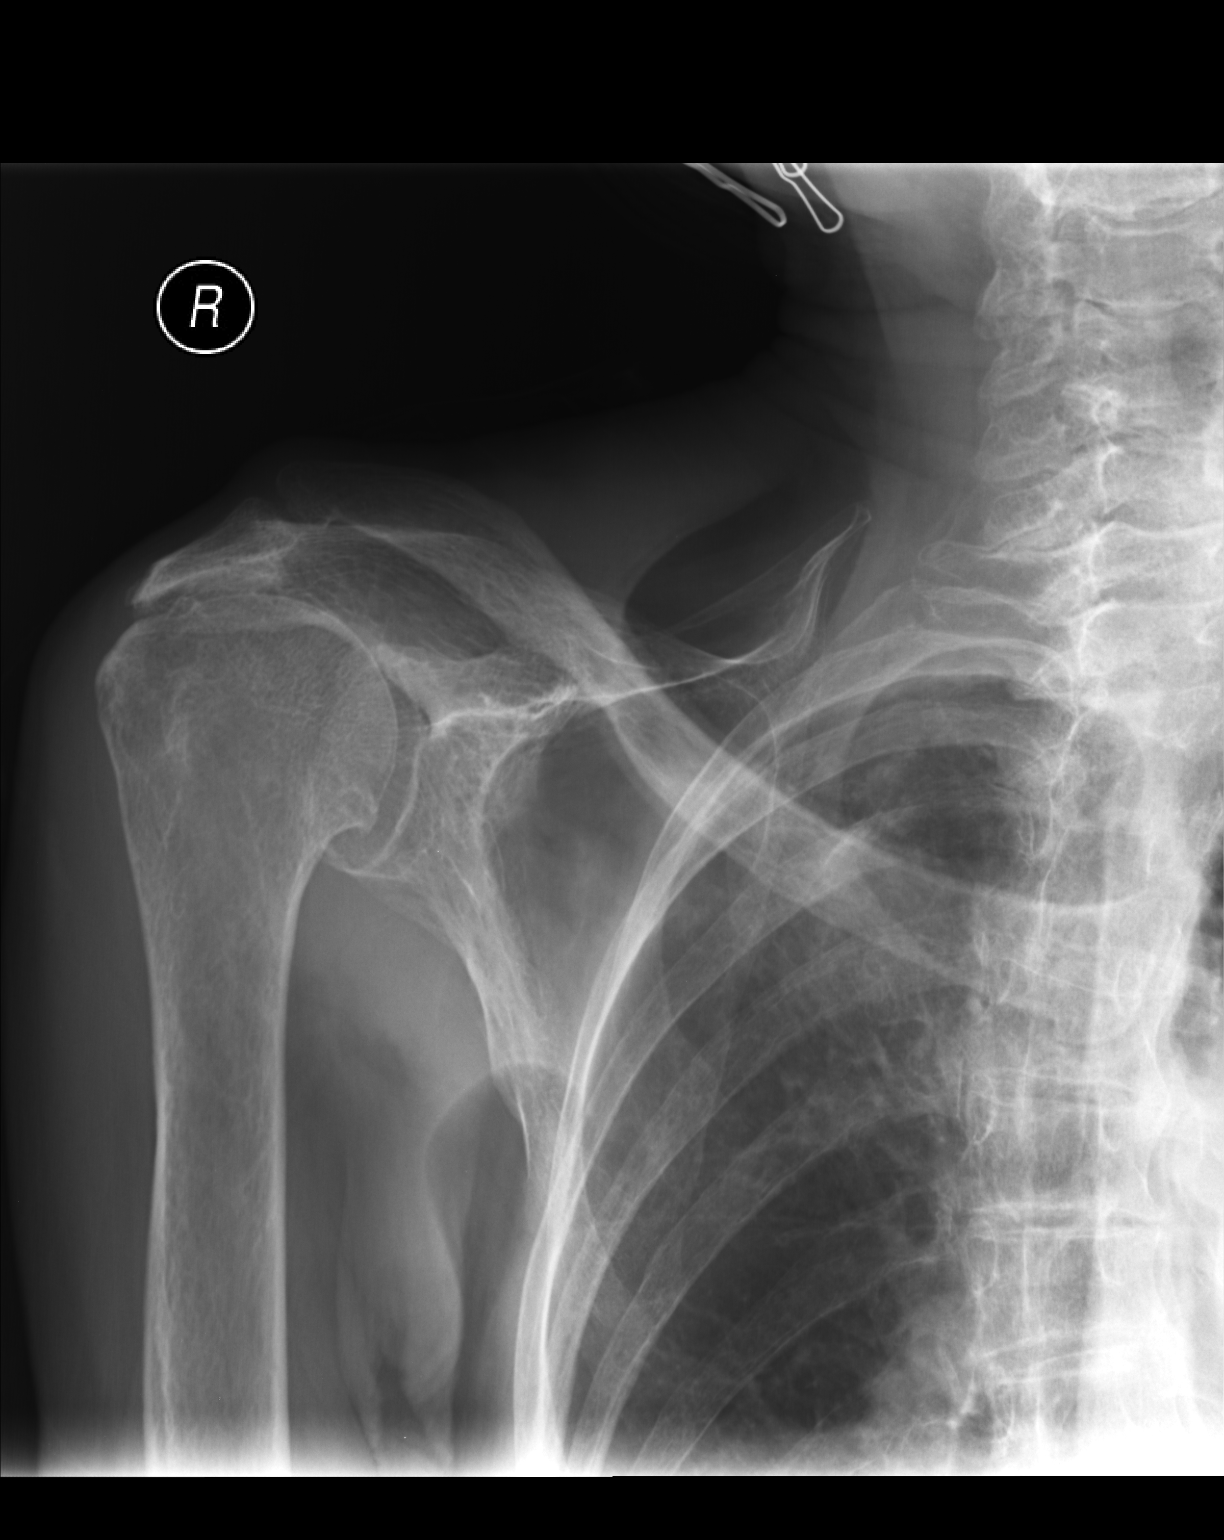

[ap int rot]
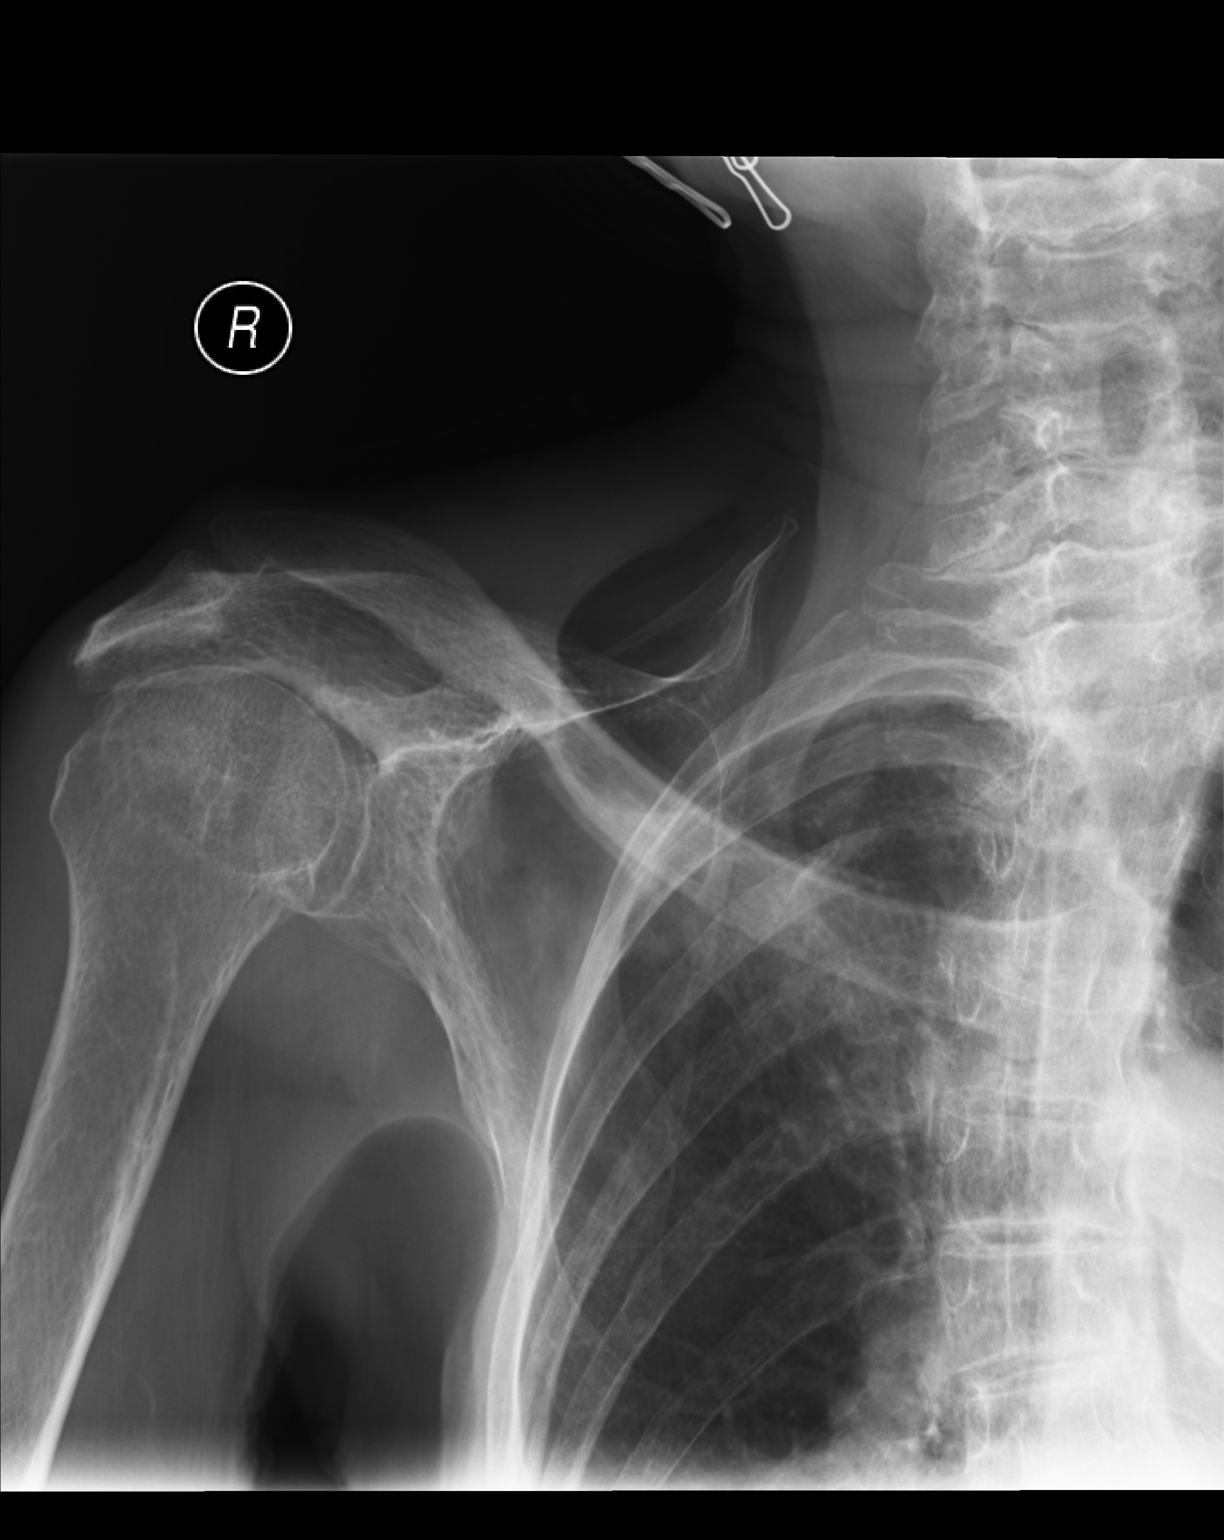

[2 of 2 positions shown; findings below may reference images not displayed]

FINDINGS: Two frontal views of the right shoulder demonstrate
progressive superior migration of the humeral head with narrowing
of the acromial humeral distance.  There are secondary hypertrophic
changes of the bone in this region as well as a mild to moderate
inferior glenohumeral spur formation.
IMPRESSION: Right shoulder degenerative changes with progressive superior
migration of the humeral head.  This suggests a large, chronic
rotator cuff tear.

## 2013-05-20 ENCOUNTER — Ambulatory Visit (INDEPENDENT_AMBULATORY_CARE_PROVIDER_SITE_OTHER): Payer: Medicare Other | Admitting: Emergency Medicine

## 2013-05-20 VITALS — BP 100/68 | HR 58 | Temp 98.0°F | Resp 16 | Wt 88.6 lb

## 2013-05-20 DIAGNOSIS — D649 Anemia, unspecified: Secondary | ICD-10-CM

## 2013-05-20 DIAGNOSIS — R634 Abnormal weight loss: Secondary | ICD-10-CM

## 2013-05-20 DIAGNOSIS — K5900 Constipation, unspecified: Secondary | ICD-10-CM

## 2013-05-20 MED ORDER — POLYETHYLENE GLYCOL 3350 17 GM/SCOOP PO POWD: 17.0000 g | Freq: Every day | ORAL | Status: DC

## 2013-05-20 NOTE — Patient Instructions (Addendum)

## 2013-05-20 NOTE — Progress Notes (Signed)
[facility] [address], [place] [address] [phone]  Date:  05/20/2013   Name:  Kristin Jennings   DOB:  [birth year]   MRN:  [id number]  PCP:  Janace Hoard, MD    Chief Complaint: Follow-up   History of Present Illness:  Kristin Jennings is a 79 y.o. very pleasant female patient who presents with the following:  Vague complaints of "20 pound weight loss in a  month" not substantiated by chart review.  Apparently has had some constipation and poor appetite.  Daughter gave her MOM last night and she had scant stool since.  No fever, chills, nausea or vomiting.  Poor appetite.  No blood in stools.  Denies other complaint or health concern today.   Patient Active Problem List   Diagnosis Date Noted  . Microcytic anemia 04/21/2013  . Pseudogout of knee 12/13/2012  . Atrial fibrillation 12/12/2012  . HTN (hypertension) 07/05/2012  . Hypothyroidism 07/05/2012  . Osteoarthritis of right knee 07/05/2012    Past Medical History  Diagnosis Date  . Arthritis   . Hypertension   . Atrial fibrillation   . Thyroid disease     History reviewed. No pertinent past surgical history.  History  Substance Use Topics  . Smoking status: Never Smoker   . Smokeless tobacco: Not on file  . Alcohol Use: No    History reviewed. No pertinent family history.  Allergies  Allergen Reactions  . Lisinopril Cough    Medication list has been reviewed and updated.  Current Outpatient Prescriptions on File Prior to Visit  Medication Sig Dispense Refill  . amLODipine (NORVASC) 5 MG tablet TAKE 1 TABLET BY MOUTH EVERY DAY  30 tablet  2  . aspirin 81 MG tablet Take 81 mg by mouth 2 (two) times daily.      . diclofenac sodium (VOLTAREN) 1 % GEL Apply 2 g topically 3 (three) times daily as needed.  1 Tube  0  . Ferrous Sulfate (IRON) 325 (65 FE) MG TABS Take 325 mg by mouth 2 (two) times daily.      . hydrALAZINE (APRESOLINE) 10 MG tablet TAKE 1 TABLET BY MOUTH  TWICE A DAY FOR CIRCULATION  60 tablet  2  . hydrochlorothiazide (MICROZIDE) 12.5 MG capsule Take 1 capsule (12.5 mg total) by mouth daily.  30 capsule  5  . levothyroxine (SYNTHROID) 75 MCG tablet Take 1 tablet (75 mcg total) by mouth daily.  90 tablet  1   No current facility-administered medications on file prior to visit.    Review of Systems:  As per HPI, otherwise negative.    Physical Examination: Filed Vitals:   05/20/13 1057  BP: 100/68  Pulse: 58  Temp: 98 F (36.7 C)  Resp: 16   Filed Vitals:   05/20/13 1057  Weight: 88 lb 9.6 oz (40.189 kg)   Body mass index is 15.2 kg/(m^2). Ideal Body Weight:     GEN: thin appearing, NAD, Non-toxic, Alert & Oriented x 3 HEENT: Atraumatic, Normocephalic.  Ears and Nose: No external deformity. EXTR: No clubbing/cyanosis/edema NEURO: Normal gait.  PSYCH: Normally interactive. Conversant. Not depressed or anxious appearing.  Calm demeanor.  CHEST Clear BS= Abdomen:  Soft flat and not tender  Assessment and Plan: Constipation Weight loss miralax Increase water  Encourage food   Signed,  Phillips Odor, MD

## 2013-05-30 ENCOUNTER — Other Ambulatory Visit (HOSPITAL_BASED_OUTPATIENT_CLINIC_OR_DEPARTMENT_OTHER): Payer: Medicare Other | Admitting: Lab

## 2013-05-30 DIAGNOSIS — D638 Anemia in other chronic diseases classified elsewhere: Secondary | ICD-10-CM

## 2013-05-30 LAB — CBC WITH DIFFERENTIAL/PLATELET
BASO%: 0.4 % (ref 0.0–2.0)
EOS%: 0.2 % (ref 0.0–7.0)
HCT: 29.1 % — ABNORMAL LOW (ref 34.8–46.6)
MCH: 23.1 pg — ABNORMAL LOW (ref 25.1–34.0)
MCHC: 31.8 g/dL (ref 31.5–36.0)
MCV: 72.7 fL — ABNORMAL LOW (ref 79.5–101.0)
MONO%: 9.3 % (ref 0.0–14.0)
NEUT%: 45.9 % (ref 38.4–76.8)
RDW: 21.5 % — ABNORMAL HIGH (ref 11.2–14.5)
lymph#: 2.3 10*3/uL (ref 0.9–3.3)

## 2013-05-30 LAB — IRON AND TIBC CHCC: TIBC: 205 ug/dL — ABNORMAL LOW (ref 236–444)

## 2013-06-03 IMAGING — CR DG CERVICAL SPINE 2 OR 3 VIEWS
2 series · 2 of 2 positions shown · non-contrast
Comparison: None

CLINICAL DATA: Neck pain

CERVICAL SPINE - 2-3 VIEW

[lateral]
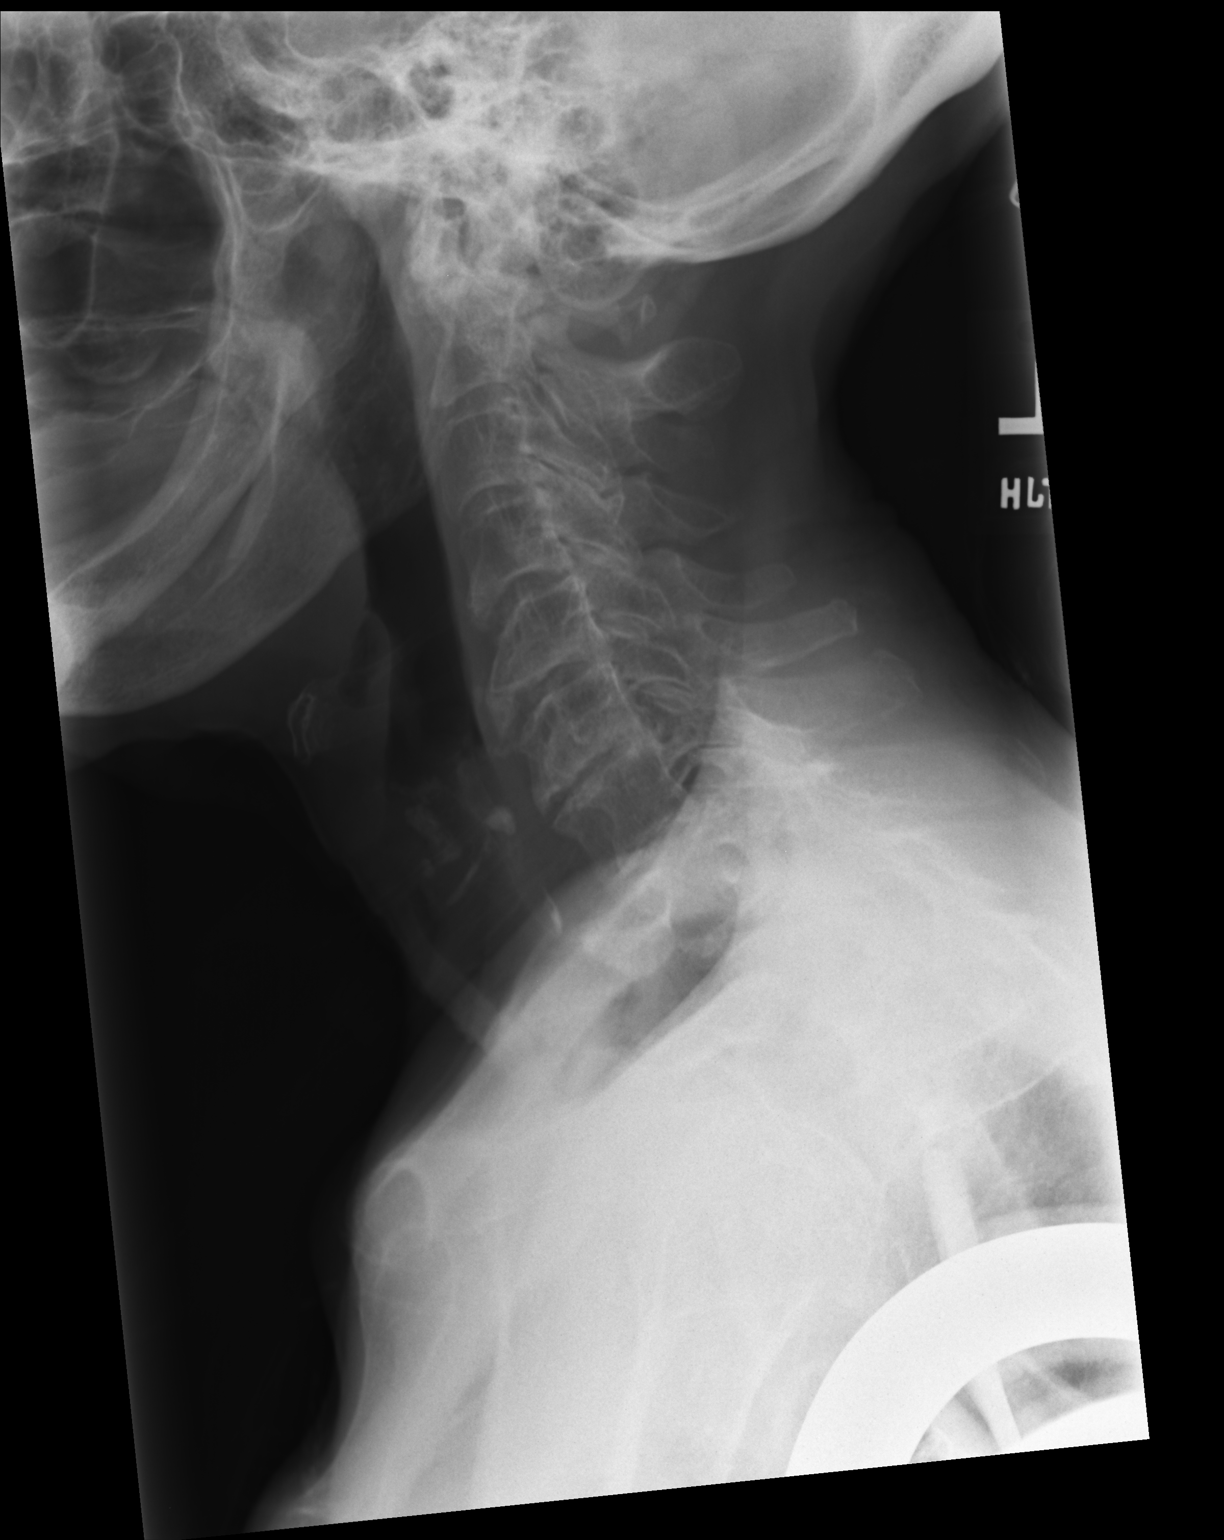

[AP]
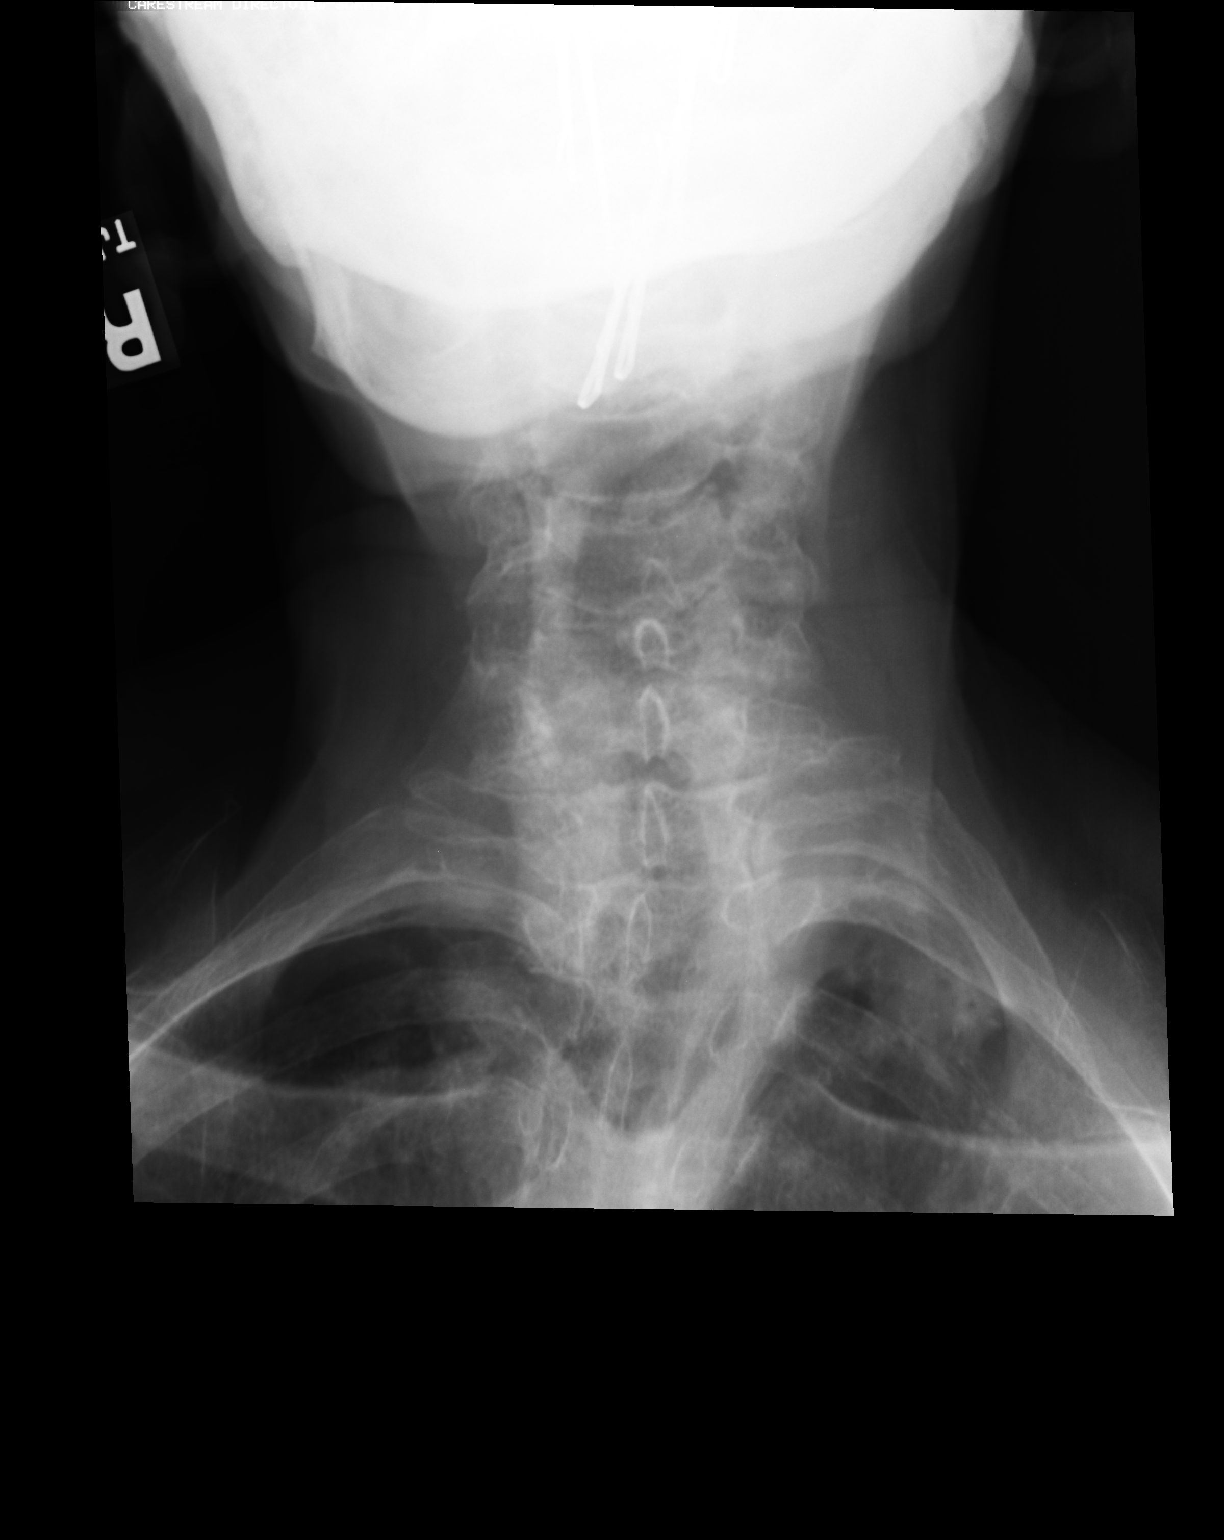

[2 of 2 positions shown; findings below may reference images not displayed]

FINDINGS: Diffuse osseous demineralization.
Prevertebral soft tissues normal thickness.
Disc space narrowing with endplate spur formation C5-C6 and C6-C7,
less at C4-C5.
Vertebral body heights maintained without fracture or subluxation.
Multilevel facet degenerative changes of the cervical spine.
Mild broad-based levoconvex cervicothoracic scoliosis.
Asymmetric left apex density could be related to asymmetric
pleuroparenchymal apical scarring but mass is not excluded; the
patient had slightly asymmetric biapical scarring on previous exam
but this asymmetry now appears more pronounced.
IMPRESSION: Degenerative disc and facet disease changes of the cervical spine.
Question increased asymmetry of biapical scarring, cannot exclude
developing mass at left apex; CT chest recommended to exclude
developing tumor.

## 2013-06-06 ENCOUNTER — Other Ambulatory Visit: Payer: Self-pay | Admitting: Family Medicine

## 2013-06-19 ENCOUNTER — Ambulatory Visit (INDEPENDENT_AMBULATORY_CARE_PROVIDER_SITE_OTHER): Payer: Medicare Other | Admitting: Family Medicine

## 2013-06-19 ENCOUNTER — Telehealth: Payer: Self-pay

## 2013-06-19 VITALS — BP 90/62 | HR 46 | Temp 97.6°F | Resp 16 | Wt 88.0 lb

## 2013-06-19 DIAGNOSIS — R634 Abnormal weight loss: Secondary | ICD-10-CM

## 2013-06-19 DIAGNOSIS — R0601 Orthopnea: Secondary | ICD-10-CM

## 2013-06-19 DIAGNOSIS — I4891 Unspecified atrial fibrillation: Secondary | ICD-10-CM

## 2013-06-19 DIAGNOSIS — R001 Bradycardia, unspecified: Secondary | ICD-10-CM

## 2013-06-19 DIAGNOSIS — E039 Hypothyroidism, unspecified: Secondary | ICD-10-CM

## 2013-06-19 DIAGNOSIS — I10 Essential (primary) hypertension: Secondary | ICD-10-CM

## 2013-06-19 DIAGNOSIS — D509 Iron deficiency anemia, unspecified: Secondary | ICD-10-CM

## 2013-06-19 DIAGNOSIS — R0600 Dyspnea, unspecified: Secondary | ICD-10-CM

## 2013-06-19 DIAGNOSIS — I959 Hypotension, unspecified: Secondary | ICD-10-CM

## 2013-06-19 LAB — POCT CBC
Hemoglobin: 9.1 g/dL — AB (ref 12.2–16.2)
Lymph, poc: 2.6 (ref 0.6–3.4)
MCH, POC: 22.8 pg — AB (ref 27–31.2)
MCHC: 29.1 g/dL — AB (ref 31.8–35.4)
MID (cbc): 0.4 (ref 0–0.9)
MPV: 7.1 fL (ref 0–99.8)
POC Granulocyte: 3.7 (ref 2–6.9)
POC MID %: 6.1 %M (ref 0–12)
Platelet Count, POC: 423 10*3/uL (ref 142–424)
RDW, POC: 22.7 %
WBC: 6.8 10*3/uL (ref 4.6–10.2)

## 2013-06-19 LAB — COMPREHENSIVE METABOLIC PANEL
ALT: 9 U/L (ref 0–35)
AST: 14 U/L (ref 0–37)
Alkaline Phosphatase: 91 U/L (ref 39–117)
Creat: 0.66 mg/dL (ref 0.50–1.10)
Sodium: 131 mEq/L — ABNORMAL LOW (ref 135–145)
Total Bilirubin: 0.3 mg/dL (ref 0.3–1.2)
Total Protein: 8.5 g/dL — ABNORMAL HIGH (ref 6.0–8.3)

## 2013-06-19 NOTE — Telephone Encounter (Signed)
Daughter reports mother is having difficulty breathing at night time.   please call [phone]

## 2013-06-19 NOTE — Patient Instructions (Addendum)
Stop your hctz.  Lets try 25mg  of diphenhydramine at night before bed to help with allergies and apply a Breath Right strip across your nose.  After your labs come back, we may want to consider decreasing your levothyroxine dose back from 75 to 50 mcg due to your weight loss. If things don't improve or get worse, please come back to clinic right away.

## 2013-06-19 NOTE — Telephone Encounter (Signed)
She should come in or go to ER for this. Called to advise, to bring here or ER if worse. Daughter agrees.

## 2013-06-19 NOTE — Progress Notes (Addendum)
Subjective:    Patient ID: Kristin Jennings, female    DOB: 1915-11-26, 79 y.o.   MRN: [id number] Chief Complaint  Patient presents with  . not able to sleep at night    started with hotflashes and not being able to breath at night      HPI Is here with her daughter Ms. Headen.  Ms. Lilia Pro daughter sleeps in the bed with pt and Ms. Droessler states that she can't breath at night and likes the window open to breath but it gets to cold.  No CP, no cough or congestion.  Appetite down.   Feels like her head and nose is stopped up a little - feels a little heavy across her forehead.  So at night she feels better when she sits up or gets out of the bed.  At night when she lays flat she feels like she is smothering. Will have a hot flashes during the daytime when she was outside last month, they have resolved now that it is not hot outside longer.   Past Medical History  Diagnosis Date  . Arthritis   . Hypertension   . Atrial fibrillation   . Thyroid disease    Current Outpatient Prescriptions on File Prior to Visit  Medication Sig Dispense Refill  . amLODipine (NORVASC) 5 MG tablet TAKE 1 TABLET BY MOUTH EVERY DAY  30 tablet  2  . aspirin 81 MG tablet Take 81 mg by mouth 2 (two) times daily.      . diclofenac sodium (VOLTAREN) 1 % GEL Apply 2 g topically 3 (three) times daily as needed.  1 Tube  0  . Ferrous Sulfate (IRON) 325 (65 FE) MG TABS Take 325 mg by mouth 2 (two) times daily.      . hydrALAZINE (APRESOLINE) 10 MG tablet TAKE 1 TABLET BY MOUTH TWICE A DAY FOR CIRCULATION  60 tablet  3  . hydrochlorothiazide (MICROZIDE) 12.5 MG capsule Take 1 capsule (12.5 mg total) by mouth daily.  30 capsule  5  . levothyroxine (SYNTHROID) 75 MCG tablet Take 1 tablet (75 mcg total) by mouth daily.  90 tablet  1  . polyethylene glycol powder (GLYCOLAX/MIRALAX) powder Take 17 g by mouth daily.  3350 g  1   No current facility-administered medications on file prior to visit.     Review of Systems   Constitutional: Positive for appetite change, fatigue and unexpected weight change. Negative for fever, chills and diaphoresis.  HENT: Positive for congestion. Negative for rhinorrhea.   Eyes: Negative for visual disturbance.  Respiratory: Negative for cough and shortness of breath.   Cardiovascular: Negative for chest pain, palpitations and leg swelling.  Genitourinary: Negative for decreased urine volume.  Neurological: Negative for syncope and headaches.  Hematological: Does not bruise/bleed easily.  Psychiatric/Behavioral: Positive for sleep disturbance.      BP 90/62  Pulse 46  Temp(Src) 97.6 F (36.4 C) (Oral)  Resp 16  Wt 88 lb (39.917 kg)  BMI 15.1 kg/m2 Objective:   Physical Exam  Constitutional: She is oriented to person, place, and time. She appears well-developed. She appears cachectic. No distress.  Able to lay flat on exam bed without difficult  HENT:  Head: Normocephalic and atraumatic.  Right Ear: External ear normal.  Left Ear: External ear normal.  Eyes: Conjunctivae are normal. No scleral icterus.  Neck: Normal range of motion. Neck supple. No thyromegaly present.  Cardiovascular: Normal rate, regular rhythm, normal heart sounds and intact distal pulses.  Pulmonary/Chest: Effort normal and breath sounds normal. No respiratory distress.  Musculoskeletal: She exhibits no edema.  Lymphadenopathy:    She has no cervical adenopathy.  Neurological: She is alert and oriented to person, place, and time.  Skin: Skin is warm and dry. She is not diaphoretic. No erythema.  Psychiatric: She has a normal mood and affect. Her behavior is normal.     EKG: NSR w/ freq PACs - see rhythm strip as well. No acute ischemic changes seen but no prior for comparison.      Results for orders placed in visit on 06/19/13  POCT CBC      Result Value Range   WBC 6.8  4.6 - 10.2 K/uL   Lymph, poc 2.6  0.6 - 3.4   POC LYMPH PERCENT 38.8  10 - 50 %L   MID (cbc) 0.4  0 - 0.9    POC MID % 6.1  0 - 12 %M   POC Granulocyte 3.7  2 - 6.9   Granulocyte percent 55.1  37 - 80 %G   RBC 4.00 (*) 4.04 - 5.48 M/uL   Hemoglobin 9.1 (*) 12.2 - 16.2 g/dL   HCT, POC 16.1 (*) 09.6 - 47.9 %   MCV 78.2 (*) 80 - 97 fL   MCH, POC 22.8 (*) 27 - 31.2 pg   MCHC 29.1 (*) 31.8 - 35.4 g/dL   RDW, POC 04.5     Platelet Count, POC 423  142 - 424 K/uL   MPV 7.1  0 - 99.8 fL    Assessment & Plan:  Orthopnea - Plan: POCT CBC, Comprehensive metabolic panel, EKG 12-Lead  Nocturnal dyspnea - Plan: POCT CBC, Comprehensive metabolic panel, EKG 12-Lead  Microcytic anemia - Plan: POCT CBC, Comprehensive metabolic panel - stable.  Hypothyroidism - Plan: POCT CBC, Comprehensive metabolic panel, TSH - with pt's weight loss, will recheck tsh but may want to consider decreasing levothyroxine back to 50 fro m75.  HTN (hypertension) - Plan: POCT CBC, Comprehensive metabolic panel, Brain natriuretic peptide, EKG 12-Lead  Atrial fibrillation - Plan: POCT CBC, Comprehensive metabolic panel, Brain natriuretic peptide, EKG 12-Lead  Bradycardia - Plan: POCT CBC, Comprehensive metabolic panel, Brain natriuretic peptide, EKG 12-Lead  Hypotension, unspecified - Plan: POCT CBC, Comprehensive metabolic panel, Brain natriuretic peptide, EKG 12-Lead - Recommend stopping the hctz.  Loss of weight - Plan: POCT CBC, Comprehensive metabolic panel, TSH, EKG 12-Lead - continue to monitor - weight has been trending down.  Dyspnea and respiratory abnormality  - Plan: Brain natriuretic peptide - ok to continue benadryl at night and try breath right nasal strips to ensure nasal congestion from allergies aren't exacerbating sxs.

## 2013-06-26 ENCOUNTER — Telehealth: Payer: Self-pay | Admitting: Hematology and Oncology

## 2013-06-26 NOTE — Telephone Encounter (Signed)
Moved 10/6 appt to 10/17 @ 8am for lb/NG. S/w dtr she is aware. appt moved from CP2 to NG.

## 2013-06-30 ENCOUNTER — Other Ambulatory Visit: Payer: Self-pay | Admitting: Physician Assistant

## 2013-06-30 ENCOUNTER — Other Ambulatory Visit: Payer: Medicare Other | Admitting: Lab

## 2013-06-30 ENCOUNTER — Ambulatory Visit: Payer: Medicare Other

## 2013-07-01 ENCOUNTER — Telehealth: Payer: Self-pay

## 2013-07-01 NOTE — Telephone Encounter (Signed)
I do not know the name of it either, she should call the pharmacy to request the medication she is out of to ensure we send in the correct medication. I have called her to advise her to call the [facility] on [address]. No answer.

## 2013-07-01 NOTE — Telephone Encounter (Signed)
Pt needs refill. Does not know the name of it, says it is on file. Uses [facility] on [address] Call at [phone].

## 2013-07-04 ENCOUNTER — Other Ambulatory Visit: Payer: Self-pay | Admitting: Family Medicine

## 2013-07-04 NOTE — Telephone Encounter (Signed)
Dr Clelia Croft, you mentioned in 06/19/13 OV notes that you might want to change Pt's  Levothyroxine dose. Do you want to RF this Rx or change dose?

## 2013-07-04 NOTE — Telephone Encounter (Signed)
Cont on same dose for now. Med refilled.

## 2013-07-09 ENCOUNTER — Other Ambulatory Visit: Payer: Self-pay | Admitting: Family Medicine

## 2013-07-10 ENCOUNTER — Other Ambulatory Visit: Payer: Self-pay | Admitting: Hematology and Oncology

## 2013-07-10 ENCOUNTER — Encounter: Payer: Self-pay | Admitting: Hematology and Oncology

## 2013-07-10 DIAGNOSIS — D649 Anemia, unspecified: Secondary | ICD-10-CM

## 2013-07-10 HISTORY — DX: Anemia, unspecified: D64.9

## 2013-07-11 ENCOUNTER — Encounter: Payer: Self-pay | Admitting: Hematology and Oncology

## 2013-07-11 ENCOUNTER — Telehealth: Payer: Self-pay | Admitting: Hematology and Oncology

## 2013-07-11 ENCOUNTER — Ambulatory Visit (HOSPITAL_BASED_OUTPATIENT_CLINIC_OR_DEPARTMENT_OTHER): Payer: Medicare Other | Admitting: Hematology and Oncology

## 2013-07-11 ENCOUNTER — Other Ambulatory Visit (HOSPITAL_BASED_OUTPATIENT_CLINIC_OR_DEPARTMENT_OTHER): Payer: Medicare Other | Admitting: Lab

## 2013-07-11 VITALS — BP 105/65 | HR 66 | Temp 96.9°F | Resp 18 | Ht 64.0 in | Wt 95.3 lb

## 2013-07-11 DIAGNOSIS — D649 Anemia, unspecified: Secondary | ICD-10-CM

## 2013-07-11 DIAGNOSIS — R918 Other nonspecific abnormal finding of lung field: Secondary | ICD-10-CM | POA: Insufficient documentation

## 2013-07-11 DIAGNOSIS — R599 Enlarged lymph nodes, unspecified: Secondary | ICD-10-CM

## 2013-07-11 DIAGNOSIS — D509 Iron deficiency anemia, unspecified: Secondary | ICD-10-CM

## 2013-07-11 DIAGNOSIS — K769 Liver disease, unspecified: Secondary | ICD-10-CM

## 2013-07-11 DIAGNOSIS — K7689 Other specified diseases of liver: Secondary | ICD-10-CM

## 2013-07-11 DIAGNOSIS — R634 Abnormal weight loss: Secondary | ICD-10-CM

## 2013-07-11 HISTORY — DX: Liver disease, unspecified: K76.9

## 2013-07-11 HISTORY — DX: Abnormal weight loss: R63.4

## 2013-07-11 LAB — COMPREHENSIVE METABOLIC PANEL (CC13)
AST: 12 U/L (ref 5–34)
Alkaline Phosphatase: 87 U/L (ref 40–150)
Glucose: 151 mg/dl — ABNORMAL HIGH (ref 70–140)
Potassium: 4.3 mEq/L (ref 3.5–5.1)
Sodium: 133 mEq/L — ABNORMAL LOW (ref 136–145)
Total Bilirubin: 0.27 mg/dL (ref 0.20–1.20)
Total Protein: 9.4 g/dL — ABNORMAL HIGH (ref 6.4–8.3)

## 2013-07-11 LAB — IRON AND TIBC CHCC
Iron: 26 ug/dL — ABNORMAL LOW (ref 41–142)
TIBC: 216 ug/dL — ABNORMAL LOW (ref 236–444)

## 2013-07-11 LAB — HOLD TUBE, BLOOD BANK

## 2013-07-11 LAB — MORPHOLOGY: PLT EST: ADEQUATE

## 2013-07-11 LAB — CBC & DIFF AND RETIC
BASO%: 0.3 % (ref 0.0–2.0)
Basophils Absolute: 0 10*3/uL (ref 0.0–0.1)
EOS%: 0.5 % (ref 0.0–7.0)
HCT: 32.4 % — ABNORMAL LOW (ref 34.8–46.6)
HGB: 9.8 g/dL — ABNORMAL LOW (ref 11.6–15.9)
Immature Retic Fract: 5.5 % (ref 1.60–10.00)
MCH: 23.6 pg — ABNORMAL LOW (ref 25.1–34.0)
MCHC: 30.2 g/dL — ABNORMAL LOW (ref 31.5–36.0)
MCV: 77.9 fL — ABNORMAL LOW (ref 79.5–101.0)
MONO#: 0.3 10*3/uL (ref 0.1–0.9)
MONO%: 4.7 % (ref 0.0–14.0)
NEUT%: 46.5 % (ref 38.4–76.8)
RBC: 4.16 10*6/uL (ref 3.70–5.45)
lymph#: 3.2 10*3/uL (ref 0.9–3.3)

## 2013-07-11 LAB — FERRITIN CHCC: Ferritin: 309 ng/ml — ABNORMAL HIGH (ref 9–269)

## 2013-07-11 NOTE — Telephone Encounter (Signed)
Gave pt appt for Md visit on 07/25/13 gave pt pt oral contrast for Ct

## 2013-07-11 NOTE — Progress Notes (Signed)
[facility] OFFICE PROGRESS NOTE  Patient Care Team: [name], MD as PCP - General (Family Medicine)  DIAGNOSIS: Weight loss, anemia  SUMMARY OF ONCOLOGIC HISTORY: This is a patient that was seen here about 2 months ago for history of chronic anemia. She was also started on oral ion supplements for anemia of chronic disease with iron deficiency. She has not had any form of GI evaluation done for iron deficiency anemia.  INTERVAL HISTORY: Kristin Jennings 79 y.o. female returns for followup regarding anemia. She denies any bleeding such as epistaxis, hematuria, or hematochezia. Denies any need for blood transfusions recently. Most worrisome, the patient complained of profound weight loss over the past few months. Her daughter state that she started to gain back a little bit of weight. She denies any new palpable lymphadenopathy. Denies any change in bowel habits. She was instructed to take 4 tablets of oral iron but she can only tolerate to oral iron tablets. It has caused constipation in the past requiring laxatives.  I have reviewed the past medical history, past surgical history, social history and family history with the patient and they are unchanged from previous note.  ALLERGIES:  is allergic to lisinopril.  MEDICATIONS:  Current Outpatient Prescriptions  Medication Sig Dispense Refill  . amLODipine (NORVASC) 5 MG tablet TAKE 1 TABLET BY MOUTH EVERY DAY  30 tablet  5  . aspirin 81 MG tablet Take 81 mg by mouth 2 (two) times daily.      . diclofenac sodium (VOLTAREN) 1 % GEL Apply 2 g topically 3 (three) times daily as needed.  1 Tube  0  . Ferrous Sulfate (IRON) 325 (65 FE) MG TABS Take 325 mg by mouth 2 (two) times daily.      . hydrALAZINE (APRESOLINE) 10 MG tablet TAKE 1 TABLET BY MOUTH TWICE A DAY FOR CIRCULATION  60 tablet  3  . levothyroxine (SYNTHROID, LEVOTHROID) 75 MCG tablet TAKE 1 TABLET BY MOUTH EVERY DAY  30 tablet  5  . polyethylene glycol  powder (GLYCOLAX/MIRALAX) powder Take 17 g by mouth daily.  3350 g  1   No current facility-administered medications for this visit.    REVIEW OF SYSTEMS:   Eyes: Denies blurriness of vision Ears, nose, mouth, throat, and face: Denies mucositis or sore throat Respiratory: Denies cough, dyspnea or wheezes Cardiovascular: Denies palpitation, chest discomfort or lower extremity swelling Skin: Denies abnormal skin rashes Lymphatics: Denies new lymphadenopathy or easy bruising Neurological:Denies numbness, tingling or new weaknesses Behavioral/Psych: Mood is stable, no new changes  All other systems were reviewed with the patient and are negative.  PHYSICAL EXAMINATION: ECOG PERFORMANCE STATUS: 1 - Symptomatic but completely ambulatory  Filed Vitals:   07/11/13 0902  BP: 105/65  Pulse: 66  Temp: 96.9 F (36.1 C)  Resp: 18   Filed Weights   07/11/13 0902  Weight: 95 lb 4.8 oz (43.228 kg)    GENERAL:alert, no distress and comfortable. She looks very thin and cachectic SKIN: skin color, texture, turgor are normal, no rashes or significant lesions EYES: normal, Conjunctiva are pale, sclera clear OROPHARYNX:no exudate, no erythema and lips, buccal mucosa, and tongue normal  NECK: supple, thyroid normal size, non-tender, without nodularity LYMPH:  no palpable lymphadenopathy in the cervical, axillary or inguinal LUNGS: clear to auscultation and percussion with normal breathing effort HEART: regular rate & rhythm and no murmurs and no lower extremity edema ABDOMEN:abdomen soft, non-tender and normal bowel sounds. Unable to appreciate any splenomegaly  Musculoskeletal:no cyanosis of digits and no clubbing  NEURO: alert & oriented x 3 with fluent speech, no focal motor/sensory deficits  LABORATORY DATA:  I have reviewed the data as listed    Component Value Date/Time   NA 133* 07/11/2013 0837   NA 131* 06/19/2013 1349   K 4.3 07/11/2013 0837   K 4.8 06/19/2013 1349   CL 95*  06/19/2013 1349   CO2 23 07/11/2013 0837   CO2 27 06/19/2013 1349   GLUCOSE 151* 07/11/2013 0837   GLUCOSE 94 06/19/2013 1349   BUN 23.2 07/11/2013 0837   BUN 24* 06/19/2013 1349   CREATININE 0.8 07/11/2013 0837   CREATININE 0.66 06/19/2013 1349   CALCIUM 9.8 07/11/2013 0837   CALCIUM 9.4 06/19/2013 1349   PROT 9.4* 07/11/2013 0837   PROT 8.5* 06/19/2013 1349   ALBUMIN 2.4* 07/11/2013 0837   ALBUMIN 3.0* 06/19/2013 1349   AST 12 07/11/2013 0837   AST 14 06/19/2013 1349   ALT <6 07/11/2013 0837   ALT 9 06/19/2013 1349   ALKPHOS 87 07/11/2013 0837   ALKPHOS 91 06/19/2013 1349   BILITOT 0.27 07/11/2013 0837   BILITOT 0.3 06/19/2013 1349    No results found for this basename: SPEP, UPEP,  kappa and lambda light chains    Lab Results  Component Value Date   WBC 6.6 07/11/2013   NEUTROABS 3.1 07/11/2013   HGB 9.8* 07/11/2013   HCT 32.4* 07/11/2013   MCV 77.9* 07/11/2013   PLT 351 07/11/2013      Chemistry      Component Value Date/Time   NA 133* 07/11/2013 0837   NA 131* 06/19/2013 1349   K 4.3 07/11/2013 0837   K 4.8 06/19/2013 1349   CL 95* 06/19/2013 1349   CO2 23 07/11/2013 0837   CO2 27 06/19/2013 1349   BUN 23.2 07/11/2013 0837   BUN 24* 06/19/2013 1349   CREATININE 0.8 07/11/2013 0837   CREATININE 0.66 06/19/2013 1349      Component Value Date/Time   CALCIUM 9.8 07/11/2013 0837   CALCIUM 9.4 06/19/2013 1349   ALKPHOS 87 07/11/2013 0837   ALKPHOS 91 06/19/2013 1349   AST 12 07/11/2013 0837   AST 14 06/19/2013 1349   ALT <6 07/11/2013 0837   ALT 9 06/19/2013 1349   BILITOT 0.27 07/11/2013 0837   BILITOT 0.3 06/19/2013 1349       RADIOGRAPHIC STUDIES: I have personally reviewed the radiological images as listed and agreed with the findings in the report. A review her last CT scan done several months ago which showed diffuse multiple pulmonary nodules and lesions in the liver of indeterminate etiology  ASSESSMENT:  #1 microcytic anemia #2 multiple nodules #3 multiple  liver lesions #4 recent weight loss  PLAN:  #1 microcytic anemia #2 multiple nodules #3 multiple liver lesions #4 recent weight loss  I expressed concern to the patient and family members. I think she needs further workup. Due to her age, she would like to avoid invasive procedures such as EGD or colonoscopy. I recommend starting with repeating CT scan of the chest, abdomen, and pelvis to rule out progression of those lesions which could be malignant. Her most recent ferritin was over 100. I recommend she stop her oral iron supplements which caused constipation This is likely anemia of chronic disease. The patient denies recent history of bleeding such as epistaxis, hematuria or hematochezia. She is asymptomatic from the anemia. We will observe for now.  she does not require transfusion  now.   Orders Placed This Encounter  Procedures  . CT Chest Wo Contrast    Standing Status: Future     Number of Occurrences:      Standing Expiration Date: 07/11/2014    Order Specific Question:  Reason for exam:    Answer:  multiple lung lesions, weight loss, worrisome for cancer    Order Specific Question:  Preferred imaging location?    Answer:  [facility]  . CT Abdomen Pelvis W Contrast    Standing Status: Future     Number of Occurrences:      Standing Expiration Date: 07/11/2014    Order Specific Question:  Reason for exam:    Answer:  weight loss, liver lesions, worrisome for cancer    Order Specific Question:  Preferred imaging location?    Answer:  [facility]   All questions were answered. The patient knows to call the clinic with any problems, questions or concerns. No barriers to learning was detected.     Snow Peoples, MD 07/11/2013 2:06 PM

## 2013-07-12 LAB — HAPTOGLOBIN: Haptoglobin: 291 mg/dL — ABNORMAL HIGH (ref 45–215)

## 2013-07-12 LAB — DIRECT ANTIGLOBULIN TEST (NOT AT ARMC): DAT (Complement): NEGATIVE

## 2013-07-14 LAB — SEDIMENTATION RATE: Sed Rate: 121 mm/hr — ABNORMAL HIGH (ref 0–22)

## 2013-07-14 LAB — ERYTHROPOIETIN: Erythropoietin: 35.9 m[IU]/mL — ABNORMAL HIGH (ref 2.6–18.5)

## 2013-07-24 ENCOUNTER — Other Ambulatory Visit: Payer: Medicare Other | Admitting: Lab

## 2013-07-24 ENCOUNTER — Ambulatory Visit (HOSPITAL_COMMUNITY)
Admission: RE | Admit: 2013-07-24 | Discharge: 2013-07-24 | Disposition: A | Discharge status: Home / Self Care | Payer: Medicare Other | Source: Ambulatory Visit | Attending: Hematology and Oncology | Admitting: Hematology and Oncology

## 2013-07-24 ENCOUNTER — Other Ambulatory Visit: Payer: Self-pay | Admitting: Hematology and Oncology

## 2013-07-24 DIAGNOSIS — R634 Abnormal weight loss: Secondary | ICD-10-CM | POA: Insufficient documentation

## 2013-07-24 DIAGNOSIS — M47817 Spondylosis without myelopathy or radiculopathy, lumbosacral region: Secondary | ICD-10-CM | POA: Insufficient documentation

## 2013-07-24 DIAGNOSIS — K769 Liver disease, unspecified: Secondary | ICD-10-CM

## 2013-07-24 DIAGNOSIS — N281 Cyst of kidney, acquired: Secondary | ICD-10-CM | POA: Insufficient documentation

## 2013-07-24 DIAGNOSIS — M899 Disorder of bone, unspecified: Secondary | ICD-10-CM | POA: Insufficient documentation

## 2013-07-24 DIAGNOSIS — K7689 Other specified diseases of liver: Secondary | ICD-10-CM | POA: Insufficient documentation

## 2013-07-24 DIAGNOSIS — I700 Atherosclerosis of aorta: Secondary | ICD-10-CM | POA: Insufficient documentation

## 2013-07-24 DIAGNOSIS — I2510 Atherosclerotic heart disease of native coronary artery without angina pectoris: Secondary | ICD-10-CM | POA: Insufficient documentation

## 2013-07-24 DIAGNOSIS — R918 Other nonspecific abnormal finding of lung field: Secondary | ICD-10-CM

## 2013-07-24 DIAGNOSIS — D649 Anemia, unspecified: Secondary | ICD-10-CM

## 2013-07-24 MED ORDER — IOHEXOL 300 MG/ML  SOLN
80.0000 mL | Freq: Once | INTRAMUSCULAR | Status: AC | PRN
  Administered 2013-07-24: 80 mL via INTRAVENOUS

## 2013-07-25 ENCOUNTER — Ambulatory Visit (HOSPITAL_BASED_OUTPATIENT_CLINIC_OR_DEPARTMENT_OTHER): Payer: Medicare Other | Admitting: Hematology and Oncology

## 2013-07-25 ENCOUNTER — Other Ambulatory Visit: Payer: Medicare Other | Admitting: Lab

## 2013-07-25 ENCOUNTER — Ambulatory Visit (HOSPITAL_BASED_OUTPATIENT_CLINIC_OR_DEPARTMENT_OTHER): Payer: Medicare Other | Admitting: Lab

## 2013-07-25 ENCOUNTER — Encounter: Payer: Self-pay | Admitting: Hematology and Oncology

## 2013-07-25 ENCOUNTER — Telehealth: Payer: Self-pay | Admitting: Hematology and Oncology

## 2013-07-25 VITALS — BP 109/66 | HR 68 | Temp 97.2°F | Resp 16 | Ht 64.0 in | Wt 97.4 lb

## 2013-07-25 DIAGNOSIS — D472 Monoclonal gammopathy: Secondary | ICD-10-CM

## 2013-07-25 DIAGNOSIS — R634 Abnormal weight loss: Secondary | ICD-10-CM

## 2013-07-25 DIAGNOSIS — R599 Enlarged lymph nodes, unspecified: Secondary | ICD-10-CM

## 2013-07-25 DIAGNOSIS — D649 Anemia, unspecified: Secondary | ICD-10-CM

## 2013-07-25 DIAGNOSIS — R918 Other nonspecific abnormal finding of lung field: Secondary | ICD-10-CM

## 2013-07-25 HISTORY — DX: Monoclonal gammopathy: D47.2

## 2013-07-25 LAB — CBC & DIFF AND RETIC
Basophils Absolute: 0 10*3/uL (ref 0.0–0.1)
EOS%: 0.2 % (ref 0.0–7.0)
Eosinophils Absolute: 0 10*3/uL (ref 0.0–0.5)
HCT: 30.8 % — ABNORMAL LOW (ref 34.8–46.6)
HGB: 9.4 g/dL — ABNORMAL LOW (ref 11.6–15.9)
MCH: 23.6 pg — ABNORMAL LOW (ref 25.1–34.0)
MCV: 77.4 fL — ABNORMAL LOW (ref 79.5–101.0)
MONO%: 8.8 % (ref 0.0–14.0)
NEUT#: 4.2 10*3/uL (ref 1.5–6.5)
NEUT%: 66.4 % (ref 38.4–76.8)
Platelets: 321 10*3/uL (ref 145–400)
RBC: 3.98 10*6/uL (ref 3.70–5.45)
Retic %: 1.21 % (ref 0.70–2.10)
Retic Ct Abs: 48.16 10*3/uL (ref 33.70–90.70)

## 2013-07-25 LAB — COMPREHENSIVE METABOLIC PANEL (CC13)
ALT: 7 U/L (ref 0–55)
AST: 13 U/L (ref 5–34)
Albumin: 2.3 g/dL — ABNORMAL LOW (ref 3.5–5.0)
Anion Gap: 8 mEq/L (ref 3–11)
BUN: 21.9 mg/dL (ref 7.0–26.0)
CO2: 25 mEq/L (ref 22–29)
Calcium: 9.8 mg/dL (ref 8.4–10.4)
Chloride: 98 mEq/L (ref 98–109)
Creatinine: 0.9 mg/dL (ref 0.6–1.1)
Potassium: 4.8 mEq/L (ref 3.5–5.1)
Sodium: 131 mEq/L — ABNORMAL LOW (ref 136–145)

## 2013-07-25 NOTE — Progress Notes (Signed)
[facility] OFFICE PROGRESS NOTE  Patient Care Team: [name], MD as PCP - General (Family Medicine)  DIAGNOSIS: Chronic anemia with weight loss  SUMMARY OF ONCOLOGIC HISTORY: This in my recent dictation for further details. This patient have chronic weight loss and anemia and was placed on oral ion supplements.  INTERVAL HISTORY: Kristin Jennings 79 y.o. female returns for to review test results. In the meantime she has no new complaints. The patient denies any recent signs or symptoms of bleeding such as spontaneous epistaxis, hematuria or hematochezia. She denies any recent fever, chills, night sweats or abnormal weight loss Denies any bone pain.  I have reviewed the past medical history, past surgical history, social history and family history with the patient and they are unchanged from previous note.  ALLERGIES:  is allergic to lisinopril.  MEDICATIONS:  Current Outpatient Prescriptions  Medication Sig Dispense Refill  . amLODipine (NORVASC) 5 MG tablet TAKE 1 TABLET BY MOUTH EVERY DAY  30 tablet  5  . aspirin 81 MG tablet Take 81 mg by mouth 2 (two) times daily.      . diclofenac sodium (VOLTAREN) 1 % GEL Apply 2 g topically 3 (three) times daily as needed.  1 Tube  0  . levothyroxine (SYNTHROID, LEVOTHROID) 75 MCG tablet TAKE 1 TABLET BY MOUTH EVERY DAY  30 tablet  5  . polyethylene glycol powder (GLYCOLAX/MIRALAX) powder Take 17 g by mouth daily.  3350 g  1   No current facility-administered medications for this visit.    REVIEW OF SYSTEMS:   Constitutional: Denies fevers, chills or abnormal weight loss All other systems were reviewed with the patient and are negative.  PHYSICAL EXAMINATION: ECOG PERFORMANCE STATUS: 2 - Symptomatic, <50% confined to bed  Filed Vitals:   07/25/13 1121  BP: 109/66  Pulse: 68  Temp: 97.2 F (36.2 C)  Resp: 16   Filed Weights   07/25/13 1121  Weight: 97 lb 6.4 oz (44.18 kg)    GENERAL:alert, no distress  and comfortable. The patient looks thin and cachectic NEURO: alert & oriented x 3 with fluent speech, no focal motor/sensory deficits  LABORATORY DATA:  I have reviewed the data as listed    Component Value Date/Time   NA 133* 07/11/2013 0837   NA 131* 06/19/2013 1349   K 4.3 07/11/2013 0837   K 4.8 06/19/2013 1349   CL 95* 06/19/2013 1349   CO2 23 07/11/2013 0837   CO2 27 06/19/2013 1349   GLUCOSE 151* 07/11/2013 0837   GLUCOSE 94 06/19/2013 1349   BUN 23.2 07/11/2013 0837   BUN 24* 06/19/2013 1349   CREATININE 0.8 07/11/2013 0837   CREATININE 0.66 06/19/2013 1349   CALCIUM 9.8 07/11/2013 0837   CALCIUM 9.4 06/19/2013 1349   PROT 9.4* 07/11/2013 0837   PROT 8.5* 06/19/2013 1349   ALBUMIN 2.4* 07/11/2013 0837   ALBUMIN 3.0* 06/19/2013 1349   AST 12 07/11/2013 0837   AST 14 06/19/2013 1349   ALT <6 07/11/2013 0837   ALT 9 06/19/2013 1349   ALKPHOS 87 07/11/2013 0837   ALKPHOS 91 06/19/2013 1349   BILITOT 0.27 07/11/2013 0837   BILITOT 0.3 06/19/2013 1349    No results found for this basename: SPEP, UPEP,  kappa and lambda light chains    Lab Results  Component Value Date   WBC 6.4 07/25/2013   NEUTROABS 4.2 07/25/2013   HGB 9.4* 07/25/2013   HCT 30.8* 07/25/2013   MCV 77.4* 07/25/2013  PLT 321 07/25/2013      Chemistry      Component Value Date/Time   NA 133* 07/11/2013 0837   NA 131* 06/19/2013 1349   K 4.3 07/11/2013 0837   K 4.8 06/19/2013 1349   CL 95* 06/19/2013 1349   CO2 23 07/11/2013 0837   CO2 27 06/19/2013 1349   BUN 23.2 07/11/2013 0837   BUN 24* 06/19/2013 1349   CREATININE 0.8 07/11/2013 0837   CREATININE 0.66 06/19/2013 1349      Component Value Date/Time   CALCIUM 9.8 07/11/2013 0837   CALCIUM 9.4 06/19/2013 1349   ALKPHOS 87 07/11/2013 0837   ALKPHOS 91 06/19/2013 1349   AST 12 07/11/2013 0837   AST 14 06/19/2013 1349   ALT <6 07/11/2013 0837   ALT 9 06/19/2013 1349   BILITOT 0.27 07/11/2013 0837   BILITOT 0.3 06/19/2013 1349       RADIOGRAPHIC  STUDIES: I have personally reviewed the radiological images as listed and agreed with the findings in the report. Ct Chest W Contrast  07/24/2013   CLINICAL DATA:  Weight loss.  EXAM: CT CHEST, ABDOMEN, AND PELVIS WITH CONTRAST  TECHNIQUE: Multidetector CT imaging of the chest, abdomen and pelvis was performed following the standard protocol during bolus administration of intravenous contrast.  CONTRAST:  80mL OMNIPAQUE IOHEXOL 300 MG/ML  SOLN  COMPARISON:  03/25/2013  FINDINGS: CT CHEST FINDINGS  There is no pleural effusion identified. There is biapical scarring. Multiple pulmonary nodules are identified in both lungs. Index nodule in the left lower lobe measures 7 mm, image 44/series 4. Previously this measured the same. There is a right lower lobe nodule measuring 6 mm, image 44/series 4. This is also unchanged from previous exam. Right upper lobe pulmonary nodule measures 8 mm and is unchanged from previous study. Next  The heart size appears normal. No pericardial effusion. Prominent mediastinal lymph nodes are again identified. Low right paratracheal lymph node measure 1.2 cm, image 20/ series 2. Previously this measured the same. The esophagus appears within normal limits. There is calcified atherosclerotic disease involving the thoracic aorta and left circumflex coronary artery.  Prominent bilateral axillary lymph nodes are identified. Within the left axilla the lymph node measures 1.1 cm, image 10/ series 2. Previously this measured 0.8 cm. Within the right axilla there is a lymph node measuring 0.9 cm, image 8/ series 2. Previously this measured the same.  Review of the visualized osseous structures shows no aggressive lytic or sclerotic bone lesions. Calcified disc fragment is identified within the canal at the T9-10 level.  CT ABDOMEN AND PELVIS FINDINGS  Multiple cysts are identified throughout the liver parenchyma. These appear similar to 2010 and are likely benign. Gallbladder is negative. No  biliary dilatation. Normal appearance of the pancreas. The spleen is unremarkable.  The adrenal glands are both normal. Cyst is identified within the inferior pole the left kidney. The right kidney is normal. The urinary bladder is on unremarkable. The uterus and adnexal structures are negative.  Normal caliber of the abdominal aorta. No aneurysm. No upper abdominal adenopathy. There is no pelvic or inguinal adenopathy noted. The stomach and small bowel loops are unremarkable. The colon is negative.  There is no significant free fluid or fluid collections within the abdomen or pelvis.  The bones are diffusely osteopenic. There is a mild scoliosis deformity with multilevel spondylosis identified.  CHEST CT IMPRESSION: 1. Similar appearance of multi focal bilateral pulmonary. These have remained unchanged from 12/22/2008 and are  favored to be the sequelae of a benign abnormality.  2. Borderline enlarged mediastinal and axillary lymph nodes. The left axillary lymph nodes appear slightly increased in size from previous exam. If there is a concern for occult malignancy consider ultrasound-guided biopsy of left axillary lymph node. The stomach appears normal. The small bowel loops are within normal limits. Normal appearance of the colon. There is no free fluid or abnormal fluid collections identified within the abdomen or pelvis. Review of the visualized osseous structures is significant for mild lumbar spondylosis. The bones appear diffusely osteopenic and there is a mild scoliosis deformity present.  ABDOMEN AND PELVIS CT IMPRESSION:  1. No acute findings.  2. No mass or adenopathy identified.  3. Liver and kidney cysts.  4. Atherosclerotic disease noted.   Electronically Signed   By: Signa Kell M.D.   On: 07/24/2013 09:52   Ct Abdomen Pelvis W Contrast  07/24/2013   CLINICAL DATA:  Weight loss.  EXAM: CT CHEST, ABDOMEN, AND PELVIS WITH CONTRAST  TECHNIQUE: Multidetector CT imaging of the chest, abdomen and  pelvis was performed following the standard protocol during bolus administration of intravenous contrast.  CONTRAST:  80mL OMNIPAQUE IOHEXOL 300 MG/ML  SOLN  COMPARISON:  03/25/2013  FINDINGS: CT CHEST FINDINGS  There is no pleural effusion identified. There is biapical scarring. Multiple pulmonary nodules are identified in both lungs. Index nodule in the left lower lobe measures 7 mm, image 44/series 4. Previously this measured the same. There is a right lower lobe nodule measuring 6 mm, image 44/series 4. This is also unchanged from previous exam. Right upper lobe pulmonary nodule measures 8 mm and is unchanged from previous study. Next  The heart size appears normal. No pericardial effusion. Prominent mediastinal lymph nodes are again identified. Low right paratracheal lymph node measure 1.2 cm, image 20/ series 2. Previously this measured the same. The esophagus appears within normal limits. There is calcified atherosclerotic disease involving the thoracic aorta and left circumflex coronary artery.  Prominent bilateral axillary lymph nodes are identified. Within the left axilla the lymph node measures 1.1 cm, image 10/ series 2. Previously this measured 0.8 cm. Within the right axilla there is a lymph node measuring 0.9 cm, image 8/ series 2. Previously this measured the same.  Review of the visualized osseous structures shows no aggressive lytic or sclerotic bone lesions. Calcified disc fragment is identified within the canal at the T9-10 level.  CT ABDOMEN AND PELVIS FINDINGS  Multiple cysts are identified throughout the liver parenchyma. These appear similar to 2010 and are likely benign. Gallbladder is negative. No biliary dilatation. Normal appearance of the pancreas. The spleen is unremarkable.  The adrenal glands are both normal. Cyst is identified within the inferior pole the left kidney. The right kidney is normal. The urinary bladder is on unremarkable. The uterus and adnexal structures are negative.   Normal caliber of the abdominal aorta. No aneurysm. No upper abdominal adenopathy. There is no pelvic or inguinal adenopathy noted. The stomach and small bowel loops are unremarkable. The colon is negative.  There is no significant free fluid or fluid collections within the abdomen or pelvis.  The bones are diffusely osteopenic. There is a mild scoliosis deformity with multilevel spondylosis identified.  CHEST CT IMPRESSION: 1. Similar appearance of multi focal bilateral pulmonary. These have remained unchanged from 12/22/2008 and are favored to be the sequelae of a benign abnormality.  2. Borderline enlarged mediastinal and axillary lymph nodes. The left axillary lymph nodes appear  slightly increased in size from previous exam. If there is a concern for occult malignancy consider ultrasound-guided biopsy of left axillary lymph node. The stomach appears normal. The small bowel loops are within normal limits. Normal appearance of the colon. There is no free fluid or abnormal fluid collections identified within the abdomen or pelvis. Review of the visualized osseous structures is significant for mild lumbar spondylosis. The bones appear diffusely osteopenic and there is a mild scoliosis deformity present.  ABDOMEN AND PELVIS CT IMPRESSION:  1. No acute findings.  2. No mass or adenopathy identified.  3. Liver and kidney cysts.  4. Atherosclerotic disease noted.   Electronically Signed   By: Signa Kell M.D.   On: 07/24/2013 09:52     ASSESSMENT:  #1 elevated total protein, concern for paraproteinemia #2 anemia #3 recent weight loss #4 lymphadenopathy  PLAN:  #1 elevated total protein I recommend we proceed with additional testing to rule out multiple myeloma. In agreement. I will defer further imaging study your urine test as the patient's family are concerned about ordering excess of testing. #2 anemia This is likely anemia of chronic disease. The patient denies recent history of bleeding such as  epistaxis, hematuria or hematochezia. She is asymptomatic from the anemia. We will observe for now.  She does not require transfusion now.  #3 recent weight loss #4 lymphadenopathy I recommend consideration for biopsy. The patient's family declined #5 pulmonary nodules The radiologist felt this could be benign because they have been no change for the last 4 years.  Orders Placed This Encounter  Procedures  . SPEP & IFE with QIG    Standing Status: Future     Number of Occurrences: 1     Standing Expiration Date: 07/25/2014  . Kappa/lambda light chains    Standing Status: Future     Number of Occurrences: 1     Standing Expiration Date: 07/25/2014  . ANA w/Reflex if Positive    Standing Status: Future     Number of Occurrences: 1     Standing Expiration Date: 07/25/2014  . Rheumatoid factor    Standing Status: Future     Number of Occurrences: 1     Standing Expiration Date: 07/25/2014  . CBC & Diff and Retic    Standing Status: Future     Number of Occurrences: 1     Standing Expiration Date: 07/25/2014  . Comprehensive metabolic panel    Standing Status: Future     Number of Occurrences: 1     Standing Expiration Date: 07/25/2014   All questions were answered. The patient knows to call the clinic with any problems, questions or concerns. No barriers to learning was detected. I spent 25 minutes counseling the patient face to face. The total time spent in the appointment was 40 minutes and more than 50% was on counseling and review of test results     [facility], Shantal Roan, MD 07/25/2013 12:54 PM

## 2013-07-25 NOTE — Telephone Encounter (Signed)
Gave pt appt for Md on November 2014 sent pt to labs today °

## 2013-07-28 LAB — ANTI-NUCLEAR AB-TITER (ANA TITER)

## 2013-07-29 LAB — SPEP & IFE WITH QIG
Albumin ELP: 32.1 % — ABNORMAL LOW (ref 55.8–66.1)
Beta 2: 5.6 % (ref 3.2–6.5)
Gamma Globulin: 37.6 % — ABNORMAL HIGH (ref 11.1–18.8)
IgA: 458 mg/dL — ABNORMAL HIGH (ref 69–380)
IgG (Immunoglobin G), Serum: 3360 mg/dL — ABNORMAL HIGH (ref 690–1700)
IgM, Serum: 175 mg/dL (ref 52–322)

## 2013-07-29 LAB — KAPPA/LAMBDA LIGHT CHAINS: Kappa:Lambda Ratio: 2.65 — ABNORMAL HIGH (ref 0.26–1.65)

## 2013-08-08 ENCOUNTER — Telehealth: Payer: Self-pay

## 2013-08-08 ENCOUNTER — Ambulatory Visit (HOSPITAL_BASED_OUTPATIENT_CLINIC_OR_DEPARTMENT_OTHER): Payer: Medicare Other | Admitting: Hematology and Oncology

## 2013-08-08 VITALS — BP 103/64 | HR 96 | Temp 96.3°F | Resp 16 | Wt 99.2 lb

## 2013-08-08 DIAGNOSIS — R634 Abnormal weight loss: Secondary | ICD-10-CM

## 2013-08-08 DIAGNOSIS — N189 Chronic kidney disease, unspecified: Secondary | ICD-10-CM

## 2013-08-08 DIAGNOSIS — R918 Other nonspecific abnormal finding of lung field: Secondary | ICD-10-CM

## 2013-08-08 DIAGNOSIS — D631 Anemia in chronic kidney disease: Secondary | ICD-10-CM

## 2013-08-08 DIAGNOSIS — R599 Enlarged lymph nodes, unspecified: Secondary | ICD-10-CM

## 2013-08-08 DIAGNOSIS — D509 Iron deficiency anemia, unspecified: Secondary | ICD-10-CM

## 2013-08-08 NOTE — Telephone Encounter (Signed)
Name Brand Name Tablet Size Instructions for use AmLODIPine Besylate (Tab) NORVASC 5 MG TAKE 1 TABLET BY MOUTH EVERY DAY Aspirin (Tab) aspirin 81 MG Take 81 mg by mouth 2 (two) times daily. Diclofenac Sodium (Gel) VOLTAREN 1 % Apply 2 g topically 3 (three) times daily as needed. Levothyroxine Sodium (Tab) SYNTHROID, LEVOTHROID 75 MCG TAKE 1 TABLET BY MOUTH EVERY DAY Polyethylene Glycol 3350 (Powder) GLYCOLAX/MIRALAX Take 17 g by mouth daily.

## 2013-08-08 NOTE — Progress Notes (Signed)
[facility] OFFICE PROGRESS NOTE  Patient Care Team: [name], MD as PCP - General (Family Medicine)  DIAGNOSIS: Chronic anemia, weight loss abnormal blood work  SUMMARY OF ONCOLOGIC HISTORY: This is a patient that was seen here about 2 months ago for history of chronic anemia. She was also started on oral ion supplements for anemia of chronic disease with iron deficiency.  INTERVAL HISTORY: Kristin Jennings 79 y.o. female returns for further followup. She denies any recent fever, chills, night sweats. Continued to have poor appetite The patient denies any recent signs or symptoms of bleeding such as spontaneous epistaxis, hematuria or hematochezia.  I have reviewed the past medical history, past surgical history, social history and family history with the patient and they are unchanged from previous note.  ALLERGIES:  is allergic to lisinopril.  MEDICATIONS:  Current Outpatient Prescriptions  Medication Sig Dispense Refill  . amLODipine (NORVASC) 5 MG tablet TAKE 1 TABLET BY MOUTH EVERY DAY  30 tablet  5  . aspirin 81 MG tablet Take 81 mg by mouth 2 (two) times daily.      . diclofenac sodium (VOLTAREN) 1 % GEL Apply 2 g topically 3 (three) times daily as needed.  1 Tube  0  . levothyroxine (SYNTHROID, LEVOTHROID) 75 MCG tablet TAKE 1 TABLET BY MOUTH EVERY DAY  30 tablet  5  . polyethylene glycol powder (GLYCOLAX/MIRALAX) powder Take 17 g by mouth daily.  3350 g  1   No current facility-administered medications for this visit.    REVIEW OF SYSTEMS:   All other systems were reviewed with the patient and are negative.  PHYSICAL EXAMINATION: ECOG PERFORMANCE STATUS: 2 - Symptomatic, <50% confined to bed  Filed Vitals:   08/08/13 1130  BP: 103/64  Pulse: 96  Temp: 96.3 F (35.7 C)  Resp: 16   Filed Weights   08/08/13 1130  Weight: 99 lb 4 oz (45.02 kg)    GENERAL:alert, no distress and comfortable. Patient appears thin, frail, cachectic NEURO:  alert & oriented x 3 with fluent speech, no focal motor/sensory deficits  LABORATORY DATA:  I have reviewed the data as listed    Component Value Date/Time   NA 131* 07/25/2013 1234   NA 131* 06/19/2013 1349   K 4.8 07/25/2013 1234   K 4.8 06/19/2013 1349   CL 95* 06/19/2013 1349   CO2 25 07/25/2013 1234   CO2 27 06/19/2013 1349   GLUCOSE 115 07/25/2013 1234   GLUCOSE 94 06/19/2013 1349   BUN 21.9 07/25/2013 1234   BUN 24* 06/19/2013 1349   CREATININE 0.9 07/25/2013 1234   CREATININE 0.66 06/19/2013 1349   CALCIUM 9.8 07/25/2013 1234   CALCIUM 9.4 06/19/2013 1349   PROT 9.2* 07/25/2013 1234   PROT 8.5* 06/19/2013 1349   ALBUMIN 2.3* 07/25/2013 1234   ALBUMIN 3.0* 06/19/2013 1349   AST 13 07/25/2013 1234   AST 14 06/19/2013 1349   ALT 7 07/25/2013 1234   ALT 9 06/19/2013 1349   ALKPHOS 83 07/25/2013 1234   ALKPHOS 91 06/19/2013 1349   BILITOT 0.42 07/25/2013 1234   BILITOT 0.3 06/19/2013 1349    No results found for this basename: SPEP,  UPEP,   kappa and lambda light chains    Lab Results  Component Value Date   WBC 6.4 07/25/2013   NEUTROABS 4.2 07/25/2013   HGB 9.4* 07/25/2013   HCT 30.8* 07/25/2013   MCV 77.4* 07/25/2013   PLT 321 07/25/2013  Chemistry      Component Value Date/Time   NA 131* 07/25/2013 1234   NA 131* 06/19/2013 1349   K 4.8 07/25/2013 1234   K 4.8 06/19/2013 1349   CL 95* 06/19/2013 1349   CO2 25 07/25/2013 1234   CO2 27 06/19/2013 1349   BUN 21.9 07/25/2013 1234   BUN 24* 06/19/2013 1349   CREATININE 0.9 07/25/2013 1234   CREATININE 0.66 06/19/2013 1349      Component Value Date/Time   CALCIUM 9.8 07/25/2013 1234   CALCIUM 9.4 06/19/2013 1349   ALKPHOS 83 07/25/2013 1234   ALKPHOS 91 06/19/2013 1349   AST 13 07/25/2013 1234   AST 14 06/19/2013 1349   ALT 7 07/25/2013 1234   ALT 9 06/19/2013 1349   BILITOT 0.42 07/25/2013 1234   BILITOT 0.3 06/19/2013 1349    ASSESSMENT & PLAN:  #1 chronic anemia #2 chronic kidney disease #3 abnormal  lymphadenopathy #4 pulmonary nodules #5 elevated total protein with low albumin, elevated immunoglobulins, worrisome for possible cell dyscrasia  I had a long discussion with the patient and family members. Her daughter is adamant she does not want to per her mother through any formal biopsy. I discussed with her that we have abnormalities in blood work and imaging studies, however further diagnosis cannot be elucidated without lymph node biopsy or bone marrow biopsy The patient is anemic and having weight loss. After prolonged discussion, they are in agreement not to do any further workup. They are comfortable not to return for any further followup on blood work. She does not want to get any form of transfusion if she become more anemic in the future. All questions were answered. The patient knows to call the clinic with any problems, questions or concerns. No barriers to learning was detected. I spent 15 minutes counseling the patient face to face. The total time spent in the appointment was 20 minutes and more than 50% was on counseling and review of test results     [facility], Nikoloz Huy, MD 08/08/2013 1:51 PM

## 2013-08-08 NOTE — Telephone Encounter (Signed)
Pts Daughter Elson Clan is confused about what medications the pt is on. She would like a call back with clarification on pts meds. Bets#[phone]

## 2013-08-08 NOTE — Telephone Encounter (Signed)
Called daughter left message for her to call me back

## 2013-08-09 ENCOUNTER — Telehealth: Payer: Self-pay | Admitting: Family Medicine

## 2013-08-09 NOTE — Telephone Encounter (Signed)
Daughter called to find out what medicines mother is taking and i told her daughter iunderstands

## 2013-08-11 ENCOUNTER — Encounter: Payer: Self-pay | Admitting: Radiology

## 2013-08-11 NOTE — Telephone Encounter (Signed)
pts daughter wants to know if coffee/tea can cause anemia in mother??   Best phone [phone]

## 2013-08-11 NOTE — Telephone Encounter (Signed)
Called left message for call back. Anemia is not caused by coffee or tea.

## 2013-08-12 NOTE — Telephone Encounter (Signed)
Patient's daughter informed anemia is not caused by coffee or tea.  Daughter states understanding.

## 2013-10-11 ENCOUNTER — Telehealth: Payer: Self-pay

## 2013-10-11 NOTE — Telephone Encounter (Signed)
Please call patient's caregiver regarding her care. Patient is 80 years old and needs to be seen. Patient's caregiver does not want to bring patient into the office out of fear that the patient may be exposed to the Flu virus.

## 2013-10-11 NOTE — Telephone Encounter (Signed)
Spoke with Altha Harm (daughter/caregiver) and she wants to know how often her mother needs lab work done to check anemia. She is not on any treatment for anemia but she does not want to be. She just wants to know how often she needs this checked so they know her levels and if they change.

## 2013-10-12 NOTE — Telephone Encounter (Signed)
There is no point in checking labs as if the labs get worse, nothing would be done.  Looks like pt was seeing hematology/oncology for this and there is a possibility the pt could have cancer but stated that she or her family would not want any transfusions, treatments, or biopsies. Therefore, no need to regularly monitor since we would not do anything with the results. Best thing is to not take any more blood from Ms. Steenson than necessary since she needs it and no need to go to dr's office during flu season.  Just follow Ms. Cervenka's symptoms and can come in if she has a problem. Here is the note from the oncologist who was working her up for her anemia.  See bold test where family did not want any further blood work and declined treatment of the anemia. "I had a long discussion with the patient and family members. Her daughter is adamant she does not want to per her mother through any formal biopsy.  I discussed with her that we have abnormalities in blood work and imaging studies, however further diagnosis cannot be elucidated without lymph node biopsy or bone marrow biopsy  The patient is anemic and having weight loss.  After prolonged discussion, they are in agreement not to do any further workup. They are comfortable not to return for any further followup on blood work. She does not want to get any form of transfusion if she become more anemic in the future.  All questions were answered. The patient knows to call the clinic with any problems, questions or concerns. No barriers to learning was detected.  FYI to Dr. Linna Darner her PCP.

## 2013-10-12 NOTE — Telephone Encounter (Signed)
Noted and agree. 

## 2013-10-14 ENCOUNTER — Telehealth: Payer: Self-pay

## 2013-10-14 NOTE — Telephone Encounter (Signed)
See duplicate phone note on 1/17 - already addressed by physicians but pt has not been contacted yet looks like.

## 2013-10-14 NOTE — Telephone Encounter (Signed)
CHRISTINE WOULD LIKE TO KNOW SINCE HER MOM IS ANEMIA, HOW OFTEN SHOULD SHE BE BRINGING HER BACK IN FOR BLOOD WORK OR LAB WORK Caspian [phone]

## 2013-10-15 NOTE — Telephone Encounter (Signed)
Advised Kristin Jennings to only rtc if mother is having symptoms or she begins to decline. Altha Harm advised at this point she is just doing everything she can to keep her mother comfortable.

## 2013-10-26 ENCOUNTER — Inpatient Hospital Stay (HOSPITAL_COMMUNITY)
Admission: EM | Admit: 2013-10-26 | Discharge: 2013-10-30 | DRG: 871 | Disposition: A | Discharge status: Home Health | Payer: Medicare Other | Attending: Internal Medicine | Admitting: Internal Medicine

## 2013-10-26 ENCOUNTER — Emergency Department (HOSPITAL_COMMUNITY): Payer: Medicare Other

## 2013-10-26 ENCOUNTER — Encounter (HOSPITAL_COMMUNITY): Payer: Self-pay | Admitting: Emergency Medicine

## 2013-10-26 DIAGNOSIS — R000 Tachycardia, unspecified: Secondary | ICD-10-CM | POA: Diagnosis present

## 2013-10-26 DIAGNOSIS — E878 Other disorders of electrolyte and fluid balance, not elsewhere classified: Secondary | ICD-10-CM | POA: Diagnosis present

## 2013-10-26 DIAGNOSIS — Z681 Body mass index (BMI) 19 or less, adult: Secondary | ICD-10-CM

## 2013-10-26 DIAGNOSIS — G9340 Encephalopathy, unspecified: Secondary | ICD-10-CM | POA: Diagnosis present

## 2013-10-26 DIAGNOSIS — D649 Anemia, unspecified: Secondary | ICD-10-CM

## 2013-10-26 DIAGNOSIS — C9000 Multiple myeloma not having achieved remission: Secondary | ICD-10-CM | POA: Diagnosis present

## 2013-10-26 DIAGNOSIS — M129 Arthropathy, unspecified: Secondary | ICD-10-CM | POA: Diagnosis present

## 2013-10-26 DIAGNOSIS — R369 Urethral discharge, unspecified: Secondary | ICD-10-CM | POA: Diagnosis present

## 2013-10-26 DIAGNOSIS — D72829 Elevated white blood cell count, unspecified: Secondary | ICD-10-CM | POA: Diagnosis present

## 2013-10-26 DIAGNOSIS — Z7982 Long term (current) use of aspirin: Secondary | ICD-10-CM

## 2013-10-26 DIAGNOSIS — A419 Sepsis, unspecified organism: Principal | ICD-10-CM | POA: Diagnosis present

## 2013-10-26 DIAGNOSIS — D509 Iron deficiency anemia, unspecified: Secondary | ICD-10-CM

## 2013-10-26 DIAGNOSIS — R339 Retention of urine, unspecified: Secondary | ICD-10-CM | POA: Diagnosis present

## 2013-10-26 DIAGNOSIS — I129 Hypertensive chronic kidney disease with stage 1 through stage 4 chronic kidney disease, or unspecified chronic kidney disease: Secondary | ICD-10-CM | POA: Diagnosis present

## 2013-10-26 DIAGNOSIS — J189 Pneumonia, unspecified organism: Secondary | ICD-10-CM | POA: Diagnosis present

## 2013-10-26 DIAGNOSIS — I10 Essential (primary) hypertension: Secondary | ICD-10-CM | POA: Diagnosis present

## 2013-10-26 DIAGNOSIS — R42 Dizziness and giddiness: Secondary | ICD-10-CM | POA: Diagnosis present

## 2013-10-26 DIAGNOSIS — R569 Unspecified convulsions: Secondary | ICD-10-CM | POA: Diagnosis present

## 2013-10-26 DIAGNOSIS — G9341 Metabolic encephalopathy: Secondary | ICD-10-CM | POA: Diagnosis present

## 2013-10-26 DIAGNOSIS — N184 Chronic kidney disease, stage 4 (severe): Secondary | ICD-10-CM | POA: Diagnosis present

## 2013-10-26 DIAGNOSIS — E43 Unspecified severe protein-calorie malnutrition: Secondary | ICD-10-CM | POA: Insufficient documentation

## 2013-10-26 DIAGNOSIS — D630 Anemia in neoplastic disease: Secondary | ICD-10-CM | POA: Diagnosis present

## 2013-10-26 DIAGNOSIS — I4891 Unspecified atrial fibrillation: Secondary | ICD-10-CM | POA: Diagnosis present

## 2013-10-26 DIAGNOSIS — E871 Hypo-osmolality and hyponatremia: Secondary | ICD-10-CM | POA: Diagnosis present

## 2013-10-26 DIAGNOSIS — E039 Hypothyroidism, unspecified: Secondary | ICD-10-CM | POA: Diagnosis present

## 2013-10-26 DIAGNOSIS — E46 Unspecified protein-calorie malnutrition: Secondary | ICD-10-CM | POA: Diagnosis present

## 2013-10-26 DIAGNOSIS — E860 Dehydration: Secondary | ICD-10-CM | POA: Diagnosis present

## 2013-10-26 DIAGNOSIS — Z66 Do not resuscitate: Secondary | ICD-10-CM | POA: Diagnosis present

## 2013-10-26 LAB — COMPREHENSIVE METABOLIC PANEL
ALK PHOS: 88 U/L (ref 39–117)
ALT: 6 U/L (ref 0–35)
AST: 14 U/L (ref 0–37)
Albumin: 2.5 g/dL — ABNORMAL LOW (ref 3.5–5.2)
BILIRUBIN TOTAL: 0.7 mg/dL (ref 0.3–1.2)
BUN: 19 mg/dL (ref 6–23)
CO2: 22 meq/L (ref 19–32)
Calcium: 9.1 mg/dL (ref 8.4–10.5)
Chloride: 92 mEq/L — ABNORMAL LOW (ref 96–112)
Creatinine, Ser: 0.91 mg/dL (ref 0.50–1.10)
GFR, EST AFRICAN AMERICAN: 60 mL/min — AB (ref >90)
GFR, EST NON AFRICAN AMERICAN: 52 mL/min — AB (ref >90)
GLUCOSE: 158 mg/dL — AB (ref 70–99)
POTASSIUM: 4.2 meq/L (ref 3.7–5.3)
Sodium: 129 mEq/L — ABNORMAL LOW (ref 137–147)
Total Protein: 9.8 g/dL — ABNORMAL HIGH (ref 6.0–8.3)

## 2013-10-26 LAB — LACTIC ACID, PLASMA: Lactic Acid, Venous: 3.2 mmol/L — ABNORMAL HIGH (ref 0.5–2.2)

## 2013-10-26 LAB — MRSA PCR SCREENING: MRSA by PCR: NEGATIVE

## 2013-10-26 LAB — CBC WITH DIFFERENTIAL/PLATELET
Basophils Absolute: 0 10*3/uL (ref 0.0–0.1)
Basophils Relative: 0 % (ref 0–1)
Eosinophils Absolute: 0 10*3/uL (ref 0.0–0.7)
Eosinophils Relative: 0 % (ref 0–5)
HCT: 30.4 % — ABNORMAL LOW (ref 36.0–46.0)
HEMOGLOBIN: 9.7 g/dL — AB (ref 12.0–15.0)
LYMPHS ABS: 1.3 10*3/uL (ref 0.7–4.0)
LYMPHS PCT: 8 % — AB (ref 12–46)
MCH: 24.2 pg — ABNORMAL LOW (ref 26.0–34.0)
MCHC: 31.9 g/dL (ref 30.0–36.0)
MCV: 75.8 fL — AB (ref 78.0–100.0)
Monocytes Absolute: 0.8 10*3/uL (ref 0.1–1.0)
Monocytes Relative: 5 % (ref 3–12)
NEUTROS PCT: 86 % — AB (ref 43–77)
Neutro Abs: 13.1 10*3/uL — ABNORMAL HIGH (ref 1.7–7.7)
PLATELETS: 320 10*3/uL (ref 150–400)
RBC: 4.01 MIL/uL (ref 3.87–5.11)
RDW: 18.8 % — ABNORMAL HIGH (ref 11.5–15.5)
WBC: 15.2 10*3/uL — AB (ref 4.0–10.5)

## 2013-10-26 LAB — POCT I-STAT TROPONIN I: Troponin i, poc: 0.01 ng/mL (ref 0.00–0.08)

## 2013-10-26 LAB — TROPONIN I: Troponin I: <0.3 ng/mL (ref <0.30)

## 2013-10-26 LAB — PRO B NATRIURETIC PEPTIDE: PRO B NATRI PEPTIDE: 2747 pg/mL — AB (ref 0–450)

## 2013-10-26 MED ORDER — DILTIAZEM HCL 100 MG IV SOLR
5.0000 mg/h | INTRAVENOUS | Status: DC
  Administered 2013-10-26: 5 mg/h via INTRAVENOUS
  Filled 2013-10-26 (×2): qty 100

## 2013-10-26 MED ORDER — DEXTROSE 5 % IV SOLN
500.0000 mg | Freq: Once | INTRAVENOUS | Status: AC
  Administered 2013-10-26: 500 mg via INTRAVENOUS

## 2013-10-26 MED ORDER — DEXTROSE 5 % IV SOLN
500.0000 mg | INTRAVENOUS | Status: DC
  Filled 2013-10-26: qty 500

## 2013-10-26 MED ORDER — BOOST PLUS PO LIQD
237.0000 mL | Freq: Three times a day (TID) | ORAL | Status: DC
  Administered 2013-10-27 – 2013-10-30 (×8): 237 mL via ORAL
  Filled 2013-10-26 (×13): qty 237

## 2013-10-26 MED ORDER — SODIUM CHLORIDE 0.9 % IV SOLN
INTRAVENOUS | Status: DC
  Administered 2013-10-26 – 2013-10-30 (×3): via INTRAVENOUS

## 2013-10-26 MED ORDER — DEXTROSE 5 % IV SOLN
1.0000 g | INTRAVENOUS | Status: DC
  Administered 2013-10-27 – 2013-10-28 (×2): 1 g via INTRAVENOUS
  Filled 2013-10-26 (×2): qty 10

## 2013-10-26 MED ORDER — DILTIAZEM LOAD VIA INFUSION
10.0000 mg | Freq: Once | INTRAVENOUS | Status: AC
  Administered 2013-10-26: 10 mg via INTRAVENOUS
  Filled 2013-10-26: qty 10

## 2013-10-26 MED ORDER — DEXTROSE 5 % IV SOLN
1.0000 g | Freq: Once | INTRAVENOUS | Status: AC
  Administered 2013-10-26: 1 g via INTRAVENOUS
  Filled 2013-10-26: qty 10

## 2013-10-26 MED ORDER — DILTIAZEM HCL 100 MG IV SOLR
5.0000 mg/h | INTRAVENOUS | Status: DC
  Administered 2013-10-26 – 2013-10-27 (×2): 5 mg/h via INTRAVENOUS
  Filled 2013-10-26: qty 100

## 2013-10-26 MED ORDER — ACETAMINOPHEN 325 MG PO TABS: 650.0000 mg | ORAL_TABLET | Freq: Four times a day (QID) | ORAL | Status: DC | PRN

## 2013-10-26 MED ORDER — HEPARIN SODIUM (PORCINE) 5000 UNIT/ML IJ SOLN
5000.0000 [IU] | Freq: Three times a day (TID) | INTRAMUSCULAR | Status: DC
  Administered 2013-10-26 – 2013-10-30 (×11): 5000 [IU] via SUBCUTANEOUS
  Filled 2013-10-26 (×15): qty 1

## 2013-10-26 MED ORDER — ASPIRIN 81 MG PO CHEW
81.0000 mg | CHEWABLE_TABLET | Freq: Two times a day (BID) | ORAL | Status: DC
  Administered 2013-10-26 – 2013-10-30 (×8): 81 mg via ORAL
  Filled 2013-10-26 (×11): qty 1

## 2013-10-26 MED ORDER — LEVOTHYROXINE SODIUM 75 MCG PO TABS
75.0000 ug | ORAL_TABLET | Freq: Every day | ORAL | Status: DC
  Administered 2013-10-27 – 2013-10-30 (×4): 75 ug via ORAL
  Filled 2013-10-26 (×5): qty 1

## 2013-10-26 MED ORDER — DEXTROSE 5 % IV SOLN
1.0000 g | INTRAVENOUS | Status: DC
  Filled 2013-10-26: qty 10

## 2013-10-26 MED ORDER — LEVOFLOXACIN 500 MG PO TABS: 500.0000 mg | ORAL_TABLET | Freq: Once | ORAL | Status: DC

## 2013-10-26 NOTE — ED Notes (Addendum)
Pt returned from CT °

## 2013-10-26 NOTE — ED Notes (Signed)
Bed: WA17 Expected date:  Expected time:  Means of arrival:  Comments: EMS cancer/seizure

## 2013-10-26 NOTE — ED Notes (Signed)
Pt from home via [facility] c/o a seizure. Pt has HX of "Blood Cancer" per family. Daughter reports to EMS that patient was sitting in her walker chair and began to have seizure like activity. Upon arrival of EMS pt was not post-ictal but incontinent of urine. Afib on 12 lead en-route.

## 2013-10-26 NOTE — ED Notes (Signed)
Pt is unable to void at this time.  

## 2013-10-26 NOTE — H&P (Signed)
[facility] History and Physical  Kristin Jennings [id number] DOB: [birth year] DOA: 10/26/2013  Referring physician:  PCP: Ruben Reason, MD   Chief Complaint: Altered metal status/cough  HPI: Kristin Jennings is a 80 y.o. female with a past medical history of atrial fibrillation, hypertension, cognitive impairment, brought to the emergency department with mental status changes. Patient had told family members last Friday that she was not feeling well, over the weekend becoming progressively weaker, having minimal by mouth intake, less interactive, having an overall functional decline. This morning family members noted her to be increasingly confused and disoriented. family members also reported patient having a cough since November however has worsened over the last several days. Her daughter stated noticing "shaking" however there was no postictal period described by family members. In the emergency room she presented in atrial fibrillation with rapid ventricular response, having heart rates in the 120s and was started on Cardizem IV.  Chest x-ray showed findings suggestive of early pneumonia, with CBC showing an elevated white count in the 50,000 range. She was started on empiric IV antibiotic therapy with ceftriaxone and azithromycin.                                                                                                                                                                                                                                        Review of Systems:  Constitutional:  Positive for weight loss, night sweats, Fevers, chills, fatigue.  HEENT:  No headaches, Difficulty swallowing,Tooth/dental problems,Sore throat,  No sneezing, itching, ear ache, nasal congestion, post nasal drip,  Cardio-vascular:  No chest pain, Orthopnea, PND, swelling in lower extremities, anasarca, dizziness, palpitations  GI:  No heartburn, indigestion, abdominal pain, nausea,  vomiting, diarrhea, change in bowel habits, loss of appetite  Resp:  Positive for shortness of breath with exertion or at rest and nonproductive cough. No excess mucus, No coughing up of blood.No change in color of mucus.No wheezing.No chest wall deformity  Skin:  no rash or lesions.  GU:  no dysuria, change in color of urine, no urgency or frequency. No flank pain.  Musculoskeletal:  No joint pain or swelling. No decreased range of motion. No back pain.  Psych:  Positive for worsening confusion  Past Medical History  Diagnosis Date  . Arthritis   . Hypertension   . Atrial fibrillation   . Thyroid disease   . Anemia, unspecified  07/10/2013  . Weight loss 07/11/2013  . Liver lesion 07/11/2013  . Monoclonal paraproteinemia 07/25/2013   History reviewed. No pertinent past surgical history. Social History:  reports that she has never smoked. She does not have any smokeless tobacco history on file. She reports that she does not drink alcohol or use illicit drugs.  Allergies  Allergen Reactions  . Lisinopril Cough    History reviewed. No pertinent family history.   Prior to Admission medications   Medication Sig Start Date End Date Taking? Authorizing Provider  amLODipine (NORVASC) 5 MG tablet TAKE 1 TABLET BY MOUTH EVERY DAY 06/30/13  Yes Shawnee Knapp, MD  aspirin 81 MG tablet Take 81 mg by mouth 2 (two) times daily.   Yes Historical Provider, MD  levothyroxine (SYNTHROID, LEVOTHROID) 75 MCG tablet TAKE 1 TABLET BY MOUTH EVERY DAY 07/04/13  Yes Shawnee Knapp, MD  polyethylene glycol (MIRALAX / GLYCOLAX) packet Take 17 g by mouth daily as needed for moderate constipation.   Yes Historical Provider, MD   Physical Exam: Filed Vitals:   10/26/13 1806  BP:   Pulse:   Temp:   Resp: 18    BP 110/70  Pulse 92  Temp(Src) 98.3 F (36.8 C) (Rectal)  Resp 18  SpO2 100%  General:  Cachectic, ill-appearing, though no acute distress Eyes: PERRL, normal lids, irises & conjunctiva ENT:  grossly normal hearing, dry oral mucosa Neck: no LAD, masses or thyromegaly Cardiovascular: Irregular rate and rhythm, tachycardic. No LE edema. Telemetry: A. fib with RVR Respiratory: Coarse respiratory sounds bilaterally, positive bibasilar crackles and rhonchi, normal respiratory effort, not using accessory muscles breathing currently on room air. Abdomen: soft, ntnd Skin: no rash or induration seen on limited exam Musculoskeletal: Significant bilateral extremity atrophy of muscles Psychiatric: grossly normal mood and affect, speech fluent and appropriate Neurologic: grossly non-focal.          Labs on Admission:  Basic Metabolic Panel:  Recent Labs Lab 10/26/13 1450  NA 129*  K 4.2  CL 92*  CO2 22  GLUCOSE 158*  BUN 19  CREATININE 0.91  CALCIUM 9.1   Liver Function Tests:  Recent Labs Lab 10/26/13 1450  AST 14  ALT 6  ALKPHOS 88  BILITOT 0.7  PROT 9.8*  ALBUMIN 2.5*   No results found for this basename: LIPASE, AMYLASE,  in the last 168 hours No results found for this basename: AMMONIA,  in the last 168 hours CBC:  Recent Labs Lab 10/26/13 1450  WBC 15.2*  NEUTROABS 13.1*  HGB 9.7*  HCT 30.4*  MCV 75.8*  PLT 320   Cardiac Enzymes:  Recent Labs Lab 10/26/13 1601  TROPONINI <0.30    BNP (last 3 results) No results found for this basename: PROBNP,  in the last 8760 hours CBG: No results found for this basename: GLUCAP,  in the last 168 hours  Radiological Exams on Admission: Dg Chest 2 View  10/26/2013   CLINICAL DATA:  Altered mental status  EXAM: CHEST  2 VIEW  COMPARISON:  CT CHEST W/CM dated 07/24/2013; CT CHEST W/CM dated 03/25/2013; DG CHEST 2V dated 03/25/2013  FINDINGS: The lungs are hyperinflated likely secondary to COPD. There is hazy right lower lobe airspace disease. There is no pleural effusion or pneumothorax. Stable mild cardiomegaly.  The osseous structures are unremarkable.  IMPRESSION: Hazy right lower lobe airspace disease which may  represent atelectasis versus developing pneumonia.   Electronically Signed   By: Kathreen Devoid   On: 10/26/2013  15:45   Ct Head Wo Contrast  10/26/2013   CLINICAL DATA:  Post seizure  EXAM: CT HEAD WITHOUT CONTRAST  TECHNIQUE: Contiguous axial images were obtained from the base of the skull through the vertex without intravenous contrast.  COMPARISON:  None  FINDINGS: Generalized atrophy.  Normal ventricular morphology.  No midline shift or mass effect.  Low attenuation along the interhemispheric fissure 6 mm thick question lipoma falx.  No intracranial hemorrhage, additional mass lesion or evidence acute infarction.  No extra-axial fluid collections.  Bones appear demineralized.  No acute bone or sinus abnormalities.  IMPRESSION: Fat attenuation along the interhemispheric fissure question lipoma of the falx.  No acute intracranial abnormalities identified.   Electronically Signed   By: Lavonia Dana M.D.   On: 10/26/2013 15:20    EKG: Independently reviewed. A. fib with RVR  Assessment/Plan Active Problems:   CAP (community acquired pneumonia)   Sepsis   Atrial fibrillation with RVR   Acute encephalopathy   HTN (hypertension)   Hypothyroidism   Leukocytosis   Dehydration   1. Sepsis, present on admission, evidenced by the development of acute encephalopathy, lab work showing white count of 15,200, heart rates in the 120s, with probable source of infection to be lungs. Will check a lactate level, admit patient to the step down unit as she require a Cardizem drip. Starting empiric IV antibiotic therapy with ceftriaxone and azithromycin, provide IV fluids, supportive care. 2. Community acquire pneumonia. Patient presenting with a steep functional decline the last several days, with lab work showed a white count of 15,200 and chest x-ray showing right lower lobe airspace disease which could represent a developing pneumonia. Will obtain blood cultures and sputum cultures, start empiric IV antibiotic  therapy with ceftriaxone and azithromycin. 3. Atrial fibrillation with RVR. She was started on a Cardizem drip in the emergency department. I suspect underlying lung process as well as dehydration contributing to the development of A. fib with RVR. Will admit patient to step down unit as I plan to continue Cardizem drip. I don't think she is an anticoagulation candidate, will continue aspirin therapy alone. 4. Acute encephalopathy. Family members finding patient to be grossly confused and disoriented also describing "shakes." This could possibly be secondary to rigors or chills from pneumonia. She's appears improved in the emergency department. Continue supportive care, IV fluids, IV antibiotics, close monitoring overnight. 5. Dehydration. Family members reporting minimal by mouth intake over the weekend, will provide normal saline at 75 mL per hour. 6. Protein calorie malnutrition. Patient weight 45 kg, appears cachectic with significant muscle atrophy. Will check a prealbumin level, provide protein boost 3 times a day 7. DVT prophylaxis. Subcutaneous heparin   Code Status: DO NOT RESUSCITATE Family Communication: I spoke with patient's daughter present at bedside Disposition Plan: I anticipate she'll require greater than 2 night hospitalization  Time spent: 25 minutes  Kelvin Cellar [facility] Pager [phone]

## 2013-10-26 NOTE — ED Provider Notes (Signed)
CSN: [id number]     Arrival date & time 10/26/13  1400 History   First MD Initiated Contact with Patient 10/26/13 1507     Chief Complaint  Patient presents with  . Seizures  . Altered Mental Status   HPI Patient is the emergency department by family for some increased confusion today.  She's also had cough for the past several days.  There is a question as to whether or not she developed seizure-like activity at home and thus the patient was brought to the emergency department.  No postictal period was described.  Patient has a history of atrial fibrillation presents with atrial fibrillation with rapid ventricular response.  Patient has a history of chronic anemia.  No recent head trauma.  Family states the mental status is improved as the day has now gone.  No reported fever.  No vomiting or diarrhea.   Past Medical History  Diagnosis Date  . Arthritis   . Hypertension   . Atrial fibrillation   . Thyroid disease   . Anemia, unspecified 07/10/2013  . Weight loss 07/11/2013  . Liver lesion 07/11/2013  . Monoclonal paraproteinemia 07/25/2013   History reviewed. No pertinent past surgical history. History reviewed. No pertinent family history. History  Substance Use Topics  . Smoking status: Never Smoker   . Smokeless tobacco: Not on file  . Alcohol Use: No   OB History   Grav Para Term Preterm Abortions TAB SAB Ect Mult Living                 Review of Systems  All other systems reviewed and are negative.    Allergies  Lisinopril  Home Medications   Current Outpatient Rx  Name  Route  Sig  Dispense  Refill  . amLODipine (NORVASC) 5 MG tablet      TAKE 1 TABLET BY MOUTH EVERY DAY   30 tablet   5   . aspirin 81 MG tablet   Oral   Take 81 mg by mouth 2 (two) times daily.         Marland Kitchen levothyroxine (SYNTHROID, LEVOTHROID) 75 MCG tablet      TAKE 1 TABLET BY MOUTH EVERY DAY   30 tablet   5   . polyethylene glycol (MIRALAX / GLYCOLAX) packet   Oral   Take 17 g  by mouth daily as needed for moderate constipation.          BP 104/62  Pulse 33  Temp(Src) 98.3 F (36.8 C) (Rectal)  Resp 29  SpO2 97% Physical Exam  Nursing note and vitals reviewed. Constitutional: She is oriented to person, place, and time. She appears well-developed and well-nourished. No distress.  HENT:  Head: Normocephalic and atraumatic.  Eyes: EOM are normal.  Neck: Normal range of motion.  Cardiovascular:  Tachycardia. Irregularly irregular  Pulmonary/Chest: Effort normal and breath sounds normal.  Abdominal: Soft. She exhibits no distension. There is no tenderness.  Musculoskeletal: Normal range of motion.  Neurological: She is alert and oriented to person, place, and time.  Skin: Skin is warm and dry.  Psychiatric: She has a normal mood and affect. Judgment normal.    ED Course  Procedures (including critical care time) Labs Review Labs Reviewed  CBC WITH DIFFERENTIAL - Abnormal; Notable for the following:    WBC 15.2 (*)    Hemoglobin 9.7 (*)    HCT 30.4 (*)    MCV 75.8 (*)    MCH 24.2 (*)    RDW 18.8 (*)  Neutrophils Relative % 86 (*)    Neutro Abs 13.1 (*)    Lymphocytes Relative 8 (*)    All other components within normal limits  COMPREHENSIVE METABOLIC PANEL - Abnormal; Notable for the following:    Sodium 129 (*)    Chloride 92 (*)    Glucose, Bld 158 (*)    Total Protein 9.8 (*)    Albumin 2.5 (*)    GFR calc non Af Amer 52 (*)    GFR calc Af Amer 60 (*)    All other components within normal limits  URINE CULTURE  CULTURE, BLOOD (ROUTINE X 2)  CULTURE, BLOOD (ROUTINE X 2)  TROPONIN I  URINALYSIS, ROUTINE W REFLEX MICROSCOPIC  POCT I-STAT TROPONIN I   Imaging Review Dg Chest 2 View  10/26/2013   CLINICAL DATA:  Altered mental status  EXAM: CHEST  2 VIEW  COMPARISON:  CT CHEST W/CM dated 07/24/2013; CT CHEST W/CM dated 03/25/2013; DG CHEST 2V dated 03/25/2013  FINDINGS: The lungs are hyperinflated likely secondary to COPD. There is hazy  right lower lobe airspace disease. There is no pleural effusion or pneumothorax. Stable mild cardiomegaly.  The osseous structures are unremarkable.  IMPRESSION: Hazy right lower lobe airspace disease which may represent atelectasis versus developing pneumonia.   Electronically Signed   By: Kathreen Devoid   On: 10/26/2013 15:45   Ct Head Wo Contrast  10/26/2013   CLINICAL DATA:  Post seizure  EXAM: CT HEAD WITHOUT CONTRAST  TECHNIQUE: Contiguous axial images were obtained from the base of the skull through the vertex without intravenous contrast.  COMPARISON:  None  FINDINGS: Generalized atrophy.  Normal ventricular morphology.  No midline shift or mass effect.  Low attenuation along the interhemispheric fissure 6 mm thick question lipoma falx.  No intracranial hemorrhage, additional mass lesion or evidence acute infarction.  No extra-axial fluid collections.  Bones appear demineralized.  No acute bone or sinus abnormalities.  IMPRESSION: Fat attenuation along the interhemispheric fissure question lipoma of the falx.  No acute intracranial abnormalities identified.   Electronically Signed   By: Lavonia Dana M.D.   On: 10/26/2013 15:20  I personally reviewed the imaging tests through PACS system I reviewed available ER/hospitalization records through the EMR   EKG Interpretation    Date/Time:  Sunday October 26 2013 14:19:59 EST Ventricular Rate:  120 PR Interval:    QRS Duration: 82 QT Interval:  336 QTC Calculation: 475 R Axis:   82 Text Interpretation:  Atrial fibrillation Ventricular bigeminy Premature ventricular complexes nonspecific st changes Confirmed by Maxime Beckner  MD, Kaneshia Cater ([id number]) on 10/26/2013 3:21:18 PM            MDM   1. CAP (community acquired pneumonia)   2. Atrial fibrillation with rapid ventricular response    Patient be admitted to hospital for community acquired pneumonia, altered metal status, atrial fibrillation with rapid ventricular response.  Start on Cardizem this  time.    Hoy Morn, MD 10/26/13 [phone]

## 2013-10-26 NOTE — ED Notes (Signed)
Hospitalist at bedside 

## 2013-10-26 NOTE — ED Notes (Addendum)
MD Campos at bedside.  

## 2013-10-26 NOTE — ED Notes (Signed)
In&Out Cath attempted, unsuccessful x2

## 2013-10-26 NOTE — ED Notes (Signed)
Patient in CT

## 2013-10-27 ENCOUNTER — Encounter (HOSPITAL_COMMUNITY): Payer: Self-pay | Admitting: *Deleted

## 2013-10-27 DIAGNOSIS — D509 Iron deficiency anemia, unspecified: Secondary | ICD-10-CM

## 2013-10-27 DIAGNOSIS — E43 Unspecified severe protein-calorie malnutrition: Secondary | ICD-10-CM | POA: Insufficient documentation

## 2013-10-27 LAB — WET PREP, GENITAL
Clue Cells Wet Prep HPF POC: NONE SEEN
TRICH WET PREP: NONE SEEN
YEAST WET PREP: NONE SEEN

## 2013-10-27 LAB — URINALYSIS, ROUTINE W REFLEX MICROSCOPIC
Bilirubin Urine: NEGATIVE
Glucose, UA: NEGATIVE mg/dL
Ketones, ur: NEGATIVE mg/dL
LEUKOCYTES UA: NEGATIVE
NITRITE: NEGATIVE
PH: 5.5 (ref 5.0–8.0)
Protein, ur: NEGATIVE mg/dL
SPECIFIC GRAVITY, URINE: 1.015 (ref 1.005–1.030)
Urobilinogen, UA: 1 mg/dL (ref 0.0–1.0)

## 2013-10-27 LAB — CBC
HEMATOCRIT: 25.2 % — AB (ref 36.0–46.0)
HEMOGLOBIN: 7.8 g/dL — AB (ref 12.0–15.0)
MCH: 23.3 pg — ABNORMAL LOW (ref 26.0–34.0)
MCHC: 31 g/dL (ref 30.0–36.0)
MCV: 75.2 fL — AB (ref 78.0–100.0)
Platelets: 331 10*3/uL (ref 150–400)
RBC: 3.35 MIL/uL — AB (ref 3.87–5.11)
RDW: 18.8 % — AB (ref 11.5–15.5)
WBC: 11.6 10*3/uL — AB (ref 4.0–10.5)

## 2013-10-27 LAB — BASIC METABOLIC PANEL
BUN: 22 mg/dL (ref 6–23)
CO2: 23 mEq/L (ref 19–32)
Calcium: 8.3 mg/dL — ABNORMAL LOW (ref 8.4–10.5)
Chloride: 92 mEq/L — ABNORMAL LOW (ref 96–112)
Creatinine, Ser: 0.87 mg/dL (ref 0.50–1.10)
GFR calc non Af Amer: 55 mL/min — ABNORMAL LOW (ref >90)
GFR, EST AFRICAN AMERICAN: 64 mL/min — AB (ref >90)
Glucose, Bld: 126 mg/dL — ABNORMAL HIGH (ref 70–99)
POTASSIUM: 4.2 meq/L (ref 3.7–5.3)
Sodium: 127 mEq/L — ABNORMAL LOW (ref 137–147)

## 2013-10-27 LAB — PREALBUMIN: PREALBUMIN: 7.7 mg/dL — AB (ref 17.0–34.0)

## 2013-10-27 LAB — TSH: TSH: 1.257 u[IU]/mL (ref 0.350–4.500)

## 2013-10-27 LAB — URINE MICROSCOPIC-ADD ON

## 2013-10-27 LAB — LACTIC ACID, PLASMA: Lactic Acid, Venous: 2.4 mmol/L — ABNORMAL HIGH (ref 0.5–2.2)

## 2013-10-27 LAB — STREP PNEUMONIAE URINARY ANTIGEN: STREP PNEUMO URINARY ANTIGEN: NEGATIVE

## 2013-10-27 MED ORDER — AZITHROMYCIN 500 MG PO TABS
500.0000 mg | ORAL_TABLET | Freq: Every day | ORAL | Status: AC
  Administered 2013-10-27 – 2013-10-28 (×2): 500 mg via ORAL
  Filled 2013-10-27 (×3): qty 1

## 2013-10-27 MED ORDER — DIGOXIN 0.25 MG/ML IJ SOLN
0.1250 mg | Freq: Once | INTRAMUSCULAR | Status: AC
  Administered 2013-10-27: 0.125 mg via INTRAVENOUS
  Filled 2013-10-27: qty 0.5

## 2013-10-27 MED ORDER — SODIUM CHLORIDE 0.9 % IV BOLUS (SEPSIS)
500.0000 mL | Freq: Once | INTRAVENOUS | Status: AC
  Administered 2013-10-27: 500 mL via INTRAVENOUS

## 2013-10-27 MED ORDER — METOPROLOL TARTRATE 1 MG/ML IV SOLN
5.0000 mg | INTRAVENOUS | Status: DC | PRN
  Administered 2013-10-27: 5 mg via INTRAVENOUS
  Filled 2013-10-27: qty 5

## 2013-10-27 MED ORDER — PANTOPRAZOLE SODIUM 40 MG PO TBEC
40.0000 mg | DELAYED_RELEASE_TABLET | Freq: Every day | ORAL | Status: DC
  Administered 2013-10-27 – 2013-10-30 (×4): 40 mg via ORAL
  Filled 2013-10-27 (×4): qty 1

## 2013-10-27 NOTE — Evaluation (Signed)
Physical Therapy Evaluation Patient Details Name: Kristin Jennings MRN: [id number] DOB: Jul 21, 1916 Today's Date: 10/27/2013 Time: 8182-9937 PT Time Calculation (min): 24 min  PT Assessment / Plan / Recommendation History of Present Illness  Kristin Jennings is a 80 y.o. female with a past medical history of atrial fibrillation, hypertension, cognitive impairment, brought to the emergency department with mental status changes. Patient had told family members last Friday that she was not feeling well, over the weekend becoming progressively weaker, having minimal by mouth intake, less interactive, having an overall functional decline. This morning family members noted her to be increasingly confused and disoriented. family members also reported patient having a cough since November however has worsened over the last several days. Her daughter stated noticing "shaking" however there was no postictal period described by family members. In the emergency room she presented in atrial fibrillation with rapid ventricular response, having heart rates in the 120s and was started on Cardizem IV.  Chest x-ray showed findings suggestive of early pneumonia, with CBC showing an elevated white count in the 50,000 range.   Clinical Impression  Pt pleasant and able to participate today in mobilizing to the [facility] and back. Pt will benefit from PT to address problems  Listed to return to functional level to Dc to home with family .    PT Assessment  Patient needs continued PT services    Follow Up Recommendations  Home health PT;Supervision/Assistance - 24 hour (if family available for 24/7 assistance.)    Does the patient have the potential to tolerate intense rehabilitation      Barriers to Discharge        Equipment Recommendations  None recommended by PT    Recommendations for Other Services     Frequency Min 3X/week    Precautions / Restrictions Precautions Precautions: Fall Restrictions Weight  Bearing Restrictions: No   Pertinent Vitals/Pain HR 81-130 sats > 92 RA        Mobility  Bed Mobility Overal bed mobility: +2 for physical assistance;Needs Assistance;+ 2 for safety/equipment Bed Mobility: Supine to Sit;Sit to Supine Supine to sit: +2 for physical assistance;+2 for safety/equipment;Mod assist Sit to supine: +2 for safety/equipment;+2 for physical assistance;Mod assist General bed mobility comments: pt able to move legs to edge and over. Assisitance to lift trunk to upright with mod assistance . Pt  assisted to supine with legs onto bed, Transfers Overall transfer level: Needs assistance Equipment used: Rolling walker (2 wheeled);1 person hand held assist Transfers: Sit to/from [facility] Sit to Stand: +2 safety/equipment;Mod assist Stand pivot transfers: +2 safety/equipment;Mod assist General transfer comment: pt able to reach to [facility] and then  pivot to bsc. Pt used RW to stand and take  several steps to bed and side step  x 3 steps.    Exercises     PT Diagnosis: Difficulty walking;Generalized weakness  PT Problem List: Decreased strength;Decreased activity tolerance;Decreased mobility;Decreased safety awareness;Decreased knowledge of use of DME;Cardiopulmonary status limiting activity PT Treatment Interventions: DME instruction;Gait training;Functional mobility training;Therapeutic activities;Therapeutic exercise;Patient/family education     PT Goals(Current goals can be found in the care plan section) Acute Rehab PT Goals Patient Stated Goal: I am not crazy. I need to get to the bathroom. PT Goal Formulation: With patient Time For Goal Achievement: 11/10/13 Potential to Achieve Goals: Good  Visit Information  Last PT Received On: 10/27/13 Assistance Needed: +2 (safety) History of Present Illness: Kristin Jennings is a 80 y.o. female with a past medical history  of atrial fibrillation, hypertension, cognitive impairment, brought to the  emergency department with mental status changes. Patient had told family members last Friday that she was not feeling well, over the weekend becoming progressively weaker, having minimal by mouth intake, less interactive, having an overall functional decline. This morning family members noted her to be increasingly confused and disoriented. family members also reported patient having a cough since November however has worsened over the last several days. Her daughter stated noticing "shaking" however there was no postictal period described by family members. In the emergency room she presented in atrial fibrillation with rapid ventricular response, having heart rates in the 120s and was started on Cardizem IV.  Chest x-ray showed findings suggestive of early pneumonia, with CBC showing an elevated white count in the 50,000 range.        Prior [place] expects to be discharged to:: Private residence Living Arrangements: Alone (has family that spens the night-daughter checks on her during the day) Available Help at Discharge: Family Type of Home: House Home Layout: One level Home Equipment: Walker - 2 wheels Additional Comments: family not present for full details. Prior Function Level of Independence: Independent with assistive device(s);Needs assistance Gait / Transfers Assistance Needed: ambulates w/ walker Communication Communication: No difficulties    Cognition  Cognition Arousal/Alertness: Awake/alert Behavior During Therapy: WFL for tasks assessed/performed Overall Cognitive Status: No family/caregiver present to determine baseline cognitive functioning (pt able to follow commands and participate. Oriented to "[place]")    Extremity/Trunk Assessment Upper Extremity Assessment Upper Extremity Assessment: Generalized weakness Lower Extremity Assessment Lower Extremity Assessment: Generalized weakness   Balance Balance Overall balance assessment:  Needs assistance Sitting-balance support: No upper extremity supported;Feet supported Sitting balance-Leahy Scale: Fair Standing balance support: Bilateral upper extremity supported;During functional activity Standing balance-Leahy Scale: Fair Standing balance comment: stands with RW and maintains balance/  End of Session PT - End of Session Activity Tolerance: Patient tolerated treatment well Patient left: in bed;with call bell/phone within reach;with bed alarm set Nurse Communication: Mobility status  GP     Claretha Cooper 10/27/2013, 10:43 AM Tresa Endo PT [phone]

## 2013-10-27 NOTE — Progress Notes (Signed)
2 seperate attempts made by floor staff to insert foley, both attempts were unsuccessfull. Will notify the MD and continue to monitor the pt.

## 2013-10-27 NOTE — Progress Notes (Signed)
INITIAL NUTRITION ASSESSMENT  Pt meets criteria for severe MALNUTRITION in the context of chronic illness as evidenced by visible severe muscle wasting and subcutaneous fat loss in orbital/temporal, clavicles, upper arms, and hands region.  DOCUMENTATION CODES Per approved criteria  -Severe malnutrition in the context of chronic illness -Underweight   INTERVENTION: - Continue Boost Plus TID - Recommend SLP evaluation as family reports pt with coughing after meals - or MD discuss with family if they want SLP evaluation due to pt's advanced age - Will continue to monitor   NUTRITION DIAGNOSIS: Increased nutrient needs related to underweight as evidenced by BMI of 16.45 kg/(m^2).   Goal: Pt to consume >90% of meals/supplements  Monitor:  Weights, labs, intake  Reason for Assessment: Underweight  80 y.o. female  Admitting Dx: Altered mental status/cough  ASSESSMENT: Pt with history of atrial fibrillation, hypertension, cognitive impairment, brought to the emergency department with mental status changes. Patient had told family members last Friday that she was not feeling well, over the weekend becoming progressively weaker, having minimal by mouth intake, less interactive, having an overall functional decline. Yesterday morning family members noted her to be increasingly confused and disoriented. family members also reported patient having a cough since November however has worsened over the last several days. Her daughter stated noticing "shaking" however there was no postictal period described by family members.   Met with pt and family. Pt reports pt eating well at home, 3 meals/day and was drinking 2 Ensure. Per conversation with pt's daughter, pt would eat a meal for breakfast, have a Boost for lunch, and have another meal around 2-3pm. She reports pt weighed 88 pounds in November, now weighs 101 pounds. She reports pt usually wears dentures. She has noticed pt with increasing  coughing after meals since December. Pt visibly cachetic.    Height: Ht Readings from Last 1 Encounters:  10/26/13 '5\' 6"'  (1.676 m)    Weight: Wt Readings from Last 1 Encounters:  10/26/13 101 lb 13.6 oz (46.2 kg)    Ideal Body Weight: 130 lb   % Ideal Body Weight: 78%  Wt Readings from Last 10 Encounters:  10/26/13 101 lb 13.6 oz (46.2 kg)  08/08/13 99 lb 4 oz (45.02 kg)  07/25/13 97 lb 6.4 oz (44.18 kg)  07/11/13 95 lb 4.8 oz (43.228 kg)  06/19/13 88 lb (39.917 kg)  05/20/13 88 lb 9.6 oz (40.189 kg)  04/29/13 95 lb (43.092 kg)  04/21/13 93 lb 9.6 oz (42.457 kg)  03/31/13 97 lb (43.999 kg)  03/25/13 98 lb (44.453 kg)    Usual Body Weight: 88 lb per daughter  % Usual Body Weight: 115%  BMI:  Body mass index is 16.45 kg/(m^2). Underweight  Estimated Nutritional Needs: Kcal: 1600-1800 Protein: 70-80g Fluid: 1.6-1.8L/day   Skin: Intact   Diet Order: General  EDUCATION NEEDS: -No education needs identified at this time   Intake/Output Summary (Last 24 hours) at 10/27/13 1019 Last data filed at 10/27/13 0600  Gross per 24 hour  Intake  712.5 ml  Output      0 ml  Net  712.5 ml    Last BM: PTA  Labs:   Recent Labs Lab 10/26/13 1450 10/27/13 0336  NA 129* 127*  K 4.2 4.2  CL 92* 92*  CO2 22 23  BUN 19 22  CREATININE 0.91 0.87  CALCIUM 9.1 8.3*  GLUCOSE 158* 126*    CBG (last 3)  No results found for this basename: GLUCAP,  in  the last 72 hours  Scheduled Meds: . aspirin  81 mg Oral BID  . azithromycin  500 mg Oral Daily  . cefTRIAXone (ROCEPHIN)  IV  1 g Intravenous Q24H  . heparin  5,000 Units Subcutaneous Q8H  . lactose free nutrition  237 mL Oral TID WC  . levothyroxine  75 mcg Oral QAC breakfast  . pantoprazole  40 mg Oral Daily    Continuous Infusions: . sodium chloride 75 mL/hr at 10/26/13 2030    Past Medical History  Diagnosis Date  . Arthritis   . Hypertension   . Atrial fibrillation   . Thyroid disease   . Anemia,  unspecified 07/10/2013  . Weight loss 07/11/2013  . Liver lesion 07/11/2013  . Monoclonal paraproteinemia 07/25/2013    History reviewed. No pertinent past surgical history.  Mikey College MS, [place], [place] Pager [phone] After Hours Pager

## 2013-10-27 NOTE — Progress Notes (Signed)
CARE MANAGEMENT NOTE 10/27/2013  Patient:  Kristin Jennings, Kristin Jennings   Account Number:  [id number]  Date Initiated:  10/27/2013  Documentation initiated by:  DAVIS,RHONDA  Subjective/Objective Assessment:   80 yo female admitted thru ed with ams due to multiple problems, a,fib, sepsis, hyponatremia, hgb of 7.9.     Action/Plan:   lives with her daughter who was present upon admission.   Anticipated DC Date:  10/30/2013   Anticipated DC Plan:  HOME/SELF CARE  In-house referral  NA      DC Planning Services  NA      [facility] Choice  NA   Choice offered to / List presented to:  NA   DME arranged  NA      DME agency  NA     [place] arranged  NA      [place] agency  NA   Status of service:  In process, will continue to follow Medicare Important Message given?  NA - LOS <3 / Initial given by admissions (If response is "NO", the following Medicare IM given date fields will be blank) Date Medicare IM given:   Date Additional Medicare IM given:    Discharge Disposition:    Per UR Regulation:  Reviewed for med. necessity/level of care/duration of stay  If discussed at [place] of Stay Meetings, dates discussed:    Comments:  [id number]/Rhonda Eldridge Dace, BSN, [place] [phone] Chart Reviewed for discharge and hospital needs. Discharge needs at time of review:  None present will follow for needs. Review of patient progress due on [id number].

## 2013-10-27 NOTE — Progress Notes (Addendum)
[facility] PROGRESS NOTE  Kristin Jennings QDI:[id number] DOB: 04-01-1916 DOA: 10/26/2013 PCP: Ruben Reason, MD  Assessment/Plan  Sepsis likely secondary to CAP of RLL vs. UTI  -  Continue ceftriaxone and azithromycin day 2 -  Continue abx -  F/u blood cultures  -  F/u UA  Dehydration suggested by hyponatremia, hypochloremia, elevated BUN:Cr, tachycardia, and elevated lactic acid -  Continue IVF -  Repeat lactic acid  Purulent-appearing urethral discharge -  UA -  Wet prep from discharge -  Urine GC/Ch  Acute urinary retention, may be related to UTI/dysuria -  Foley catheter  MAT and possibly some intermittent A-fib.  Has known hx of paroxysmal a-fib per notes.  I think tachycardia was more likely related to dehydration.  Has been sinus rhythm overnight and HR now stable.  BP probably low secondary to dilt gtt. -  D/c dilt -  Continue IVF -  Continue ASA  Acute encephalopathy due to sepsis, resolving. -  Cont tx for infection and dehydration  HTN, blood pressure low due to sepsis, dehydration, and dilt -  D/c dilt - hold norvasc  Hypothyroidism, TSH wnl, continue synthroid  Severe protein calorie malnutrition -  Regular diet with supplements -  Nutrition consultation -  Consider appetite stimulant  Hyponatremia, likely secondary to dehydration -  Continue IVF -  TSH wnl -  Consider cortisol level -  Monitor for heart failure -  No evidence of cirrhosis  Leukocytosis, resolving with IVF and abx. -  Repeat in AM  Microcytic anemia likely secondary to multiple myeloma.  Patient has been seen by oncology and she has declined further evaluation and treatment.  She does not want blood transfusions.   -  Minimize blood draws  Diet:  regular Access:  PIV IVF:  yes Proph:  heparin  Code Status: DNR Family Communication: patient alone Disposition Plan: pending improvement in breathing, heart rate, stable hemoglobin.  Continue stepdown for now, but if  continuing to improve clinically and HR stable off of dilt, okay to transfer to telemetry tonight or tomorrow morning   Consultants:  none  Procedures:  CXR  CT head  Antibiotics:  Ceftriaxone 2/1 >>  Azithromycin 2/1 >>   HPI/Subjective:  States she feels much better and her thinking is clearer.  Understands that she is having problems voiding, however, denies dysuria.  Has had some palpitations possibly or some nausea felt in the epigastric region to LUQ.  Denies CP, SOB.    Objective: Filed Vitals:   10/27/13 0000 10/27/13 0202 10/27/13 0400 10/27/13 0800  BP: 106/56 100/54 95/42 113/40  Pulse:  62 37   Temp: 98.6 F (37 C)  99.3 F (37.4 C) 98.4 F (36.9 C)  TempSrc: Oral  Oral Oral  Resp: _0 Height:      Weight:      SpO2:  95% 92%     Intake/Output Summary (Last 24 hours) at 10/27/13 0947 Last data filed at 10/27/13 0600  Gross per 24 hour  Intake  712.5 ml  Output      0 ml  Net  712.5 ml   Filed Weights   10/26/13 2015  Weight: 46.2 kg (101 lb 13.6 oz)    Exam:   General:  Cachectic AAF, No acute distress  HEENT:  NCAT, MMM  Cardiovascular:  IRRR, nl S1, S2 2/6 systolic murmur LSB, 2+ pulses, warm extremities  Respiratory:  CTAB, no increased WOB  Abdomen:   NABS, soft, NT/ND  GU:  Vulva is swollen, no obvious discharge at this time but was just cleaned up by nursing staff  MSK:   Normal tone and bulk, no LEE  Neuro:  Grossly intact, decreased muscle bulk  Data Reviewed: Basic Metabolic Panel:  Recent Labs Lab 10/26/13 1450 10/27/13 0336  NA 129* 127*  K 4.2 4.2  CL 92* 92*  CO2 22 23  GLUCOSE 158* 126*  BUN 19 22  CREATININE 0.91 0.87  CALCIUM 9.1 8.3*   Liver Function Tests:  Recent Labs Lab 10/26/13 1450  AST 14  ALT 6  ALKPHOS 88  BILITOT 0.7  PROT 9.8*  ALBUMIN 2.5*   No results found for this basename: LIPASE, AMYLASE,  in the last 168 hours No results found for this basename: AMMONIA,  in the  last 168 hours CBC:  Recent Labs Lab 10/26/13 1450 10/27/13 0336  WBC 15.2* 11.6*  NEUTROABS 13.1*  --   HGB 9.7* 7.8*  HCT 30.4* 25.2*  MCV 75.8* 75.2*  PLT 320 331   Cardiac Enzymes:  Recent Labs Lab 10/26/13 1601  TROPONINI <0.30   BNP (last 3 results)  Recent Labs  10/26/13 2142  PROBNP 2747.0*   CBG: No results found for this basename: GLUCAP,  in the last 168 hours  Recent Results (from the past 240 hour(s))  CULTURE, BLOOD (ROUTINE X 2)     Status: None   Collection Time    10/26/13  2:50 PM      Result Value Range Status   Specimen Description BLOOD RIGHT FOREARM  5 ML IN [facility] BOTTLE   Final   Special Requests NONE   Final   Culture  Setup Time     Final   Value: 10/26/2013 20:45     Performed at [facility]   Culture     Final   Value:        BLOOD CULTURE RECEIVED NO GROWTH TO DATE CULTURE WILL BE HELD FOR 5 DAYS BEFORE ISSUING A FINAL NEGATIVE REPORT     Performed at [facility]   Report Status PENDING   Incomplete  CULTURE, BLOOD (ROUTINE X 2)     Status: None   Collection Time    10/26/13  3:51 PM      Result Value Range Status   Specimen Description BLOOD RIGHT ARM   Final   Special Requests BOTTLES DRAWN AEROBIC AND ANAEROBIC [facility] EACH   Final   Culture  Setup Time     Final   Value: 10/26/2013 20:45     Performed at [facility]   Culture     Final   Value:        BLOOD CULTURE RECEIVED NO GROWTH TO DATE CULTURE WILL BE HELD FOR 5 DAYS BEFORE ISSUING A FINAL NEGATIVE REPORT     Performed at [facility]   Report Status PENDING   Incomplete  MRSA PCR SCREENING     Status: None   Collection Time    10/26/13  8:24 PM      Result Value Range Status   MRSA by PCR NEGATIVE  NEGATIVE Final   Comment:            The GeneXpert MRSA Assay (FDA     approved for NASAL specimens     only), is one component of a     comprehensive MRSA colonization     surveillance program. It is not     intended to diagnose  MRSA      infection nor to guide or     monitor treatment for     MRSA infections.     Studies: Dg Chest 2 View  10/26/2013   CLINICAL DATA:  Altered mental status  EXAM: CHEST  2 VIEW  COMPARISON:  CT CHEST W/CM dated 07/24/2013; CT CHEST W/CM dated 03/25/2013; DG CHEST 2V dated 03/25/2013  FINDINGS: The lungs are hyperinflated likely secondary to COPD. There is hazy right lower lobe airspace disease. There is no pleural effusion or pneumothorax. Stable mild cardiomegaly.  The osseous structures are unremarkable.  IMPRESSION: Hazy right lower lobe airspace disease which may represent atelectasis versus developing pneumonia.   Electronically Signed   By: Kathreen Devoid   On: 10/26/2013 15:45   Ct Head Wo Contrast  10/26/2013   CLINICAL DATA:  Post seizure  EXAM: CT HEAD WITHOUT CONTRAST  TECHNIQUE: Contiguous axial images were obtained from the base of the skull through the vertex without intravenous contrast.  COMPARISON:  None  FINDINGS: Generalized atrophy.  Normal ventricular morphology.  No midline shift or mass effect.  Low attenuation along the interhemispheric fissure 6 mm thick question lipoma falx.  No intracranial hemorrhage, additional mass lesion or evidence acute infarction.  No extra-axial fluid collections.  Bones appear demineralized.  No acute bone or sinus abnormalities.  IMPRESSION: Fat attenuation along the interhemispheric fissure question lipoma of the falx.  No acute intracranial abnormalities identified.   Electronically Signed   By: Lavonia Dana M.D.   On: 10/26/2013 15:20    Scheduled Meds: . aspirin  81 mg Oral BID  . azithromycin  500 mg Intravenous Q24H  . cefTRIAXone (ROCEPHIN)  IV  1 g Intravenous Q24H  . heparin  5,000 Units Subcutaneous Q8H  . lactose free nutrition  237 mL Oral TID WC  . levothyroxine  75 mcg Oral QAC breakfast   Continuous Infusions: . sodium chloride 75 mL/hr at 10/26/13 2030    Active Problems:   HTN (hypertension)   Hypothyroidism   CAP (community  acquired pneumonia)   Sepsis   Atrial fibrillation with RVR   Leukocytosis   Dehydration   Acute encephalopathy   Protein calorie malnutrition    Time spent: 30 min    Dickie Labarre, [place] [facility] Pager [phone]. If 7PM-7AM, please contact night-coverage at [email or web address], password [facility] 10/27/2013, 9:47 AM  LOS: 1 day

## 2013-10-27 NOTE — Progress Notes (Signed)
Pt still with HR from 110-160's. Midlevel paged awaiting call back.

## 2013-10-27 NOTE — Progress Notes (Signed)
Patient had not urinated since she was brought up to the floor. Oncall was notified and gave an order for an in and out cath. Two RN's tried three different times and was unable to cath her. She is very swollen and has a lot of vaginal or urethral pus. RN made doctor aware. New orders were given for a foley.

## 2013-10-27 NOTE — Progress Notes (Signed)
Pt with hr above 100-130's called midlevel called awaiting call back.

## 2013-10-28 LAB — CBC
HCT: 23.1 % — ABNORMAL LOW (ref 36.0–46.0)
Hemoglobin: 7.4 g/dL — ABNORMAL LOW (ref 12.0–15.0)
MCH: 24 pg — ABNORMAL LOW (ref 26.0–34.0)
MCHC: 32 g/dL (ref 30.0–36.0)
MCV: 75 fL — ABNORMAL LOW (ref 78.0–100.0)
PLATELETS: 317 10*3/uL (ref 150–400)
RBC: 3.08 MIL/uL — AB (ref 3.87–5.11)
RDW: 19 % — ABNORMAL HIGH (ref 11.5–15.5)
WBC: 9.5 10*3/uL (ref 4.0–10.5)

## 2013-10-28 LAB — URINE CULTURE
CULTURE: NO GROWTH
Colony Count: NO GROWTH

## 2013-10-28 LAB — BASIC METABOLIC PANEL
BUN: 18 mg/dL (ref 6–23)
CALCIUM: 7.9 mg/dL — AB (ref 8.4–10.5)
CO2: 20 mEq/L (ref 19–32)
Chloride: 98 mEq/L (ref 96–112)
Creatinine, Ser: 0.79 mg/dL (ref 0.50–1.10)
GFR, EST AFRICAN AMERICAN: 79 mL/min — AB (ref >90)
GFR, EST NON AFRICAN AMERICAN: 69 mL/min — AB (ref >90)
Glucose, Bld: 103 mg/dL — ABNORMAL HIGH (ref 70–99)
POTASSIUM: 4.1 meq/L (ref 3.7–5.3)
SODIUM: 130 meq/L — AB (ref 137–147)

## 2013-10-28 LAB — LEGIONELLA ANTIGEN, URINE: Legionella Antigen, Urine: NEGATIVE

## 2013-10-28 MED ORDER — AMIODARONE HCL IN DEXTROSE 360-4.14 MG/200ML-% IV SOLN
30.0000 mg/h | INTRAVENOUS | Status: DC
  Administered 2013-10-28 – 2013-10-29 (×2): 30 mg/h via INTRAVENOUS
  Filled 2013-10-28 (×2): qty 200

## 2013-10-28 MED ORDER — METOPROLOL TARTRATE 1 MG/ML IV SOLN: 5.0000 mg | Freq: Four times a day (QID) | INTRAVENOUS | Status: DC | PRN

## 2013-10-28 MED ORDER — AMIODARONE HCL IN DEXTROSE 360-4.14 MG/200ML-% IV SOLN
60.0000 mg/h | INTRAVENOUS | Status: AC
  Administered 2013-10-28 (×2): 60 mg/h via INTRAVENOUS
  Filled 2013-10-28 (×2): qty 200

## 2013-10-28 MED ORDER — AMIODARONE LOAD VIA INFUSION
150.0000 mg | Freq: Once | INTRAVENOUS | Status: AC
  Administered 2013-10-28: 150 mg via INTRAVENOUS
  Filled 2013-10-28: qty 83.34

## 2013-10-28 NOTE — Progress Notes (Signed)
  Comments:  [id number] Rosana Hoes RN, BSN, CCM, [phone] Chart reviewed for update of needs and condition. iv cardizem drip and cardiac monitoring due to a.fib with rvr ongoing.

## 2013-10-28 NOTE — Progress Notes (Signed)
OT Cancellation Note  Patient Details Name: CEDRICA BRUNE MRN: [id number] DOB: [birth year]   Cancelled Treatment:    Reason Eval/Treat Not Completed: Medical issues which prohibited therapy  Jules Schick [phone] 10/28/2013, 1:02 PM

## 2013-10-28 NOTE — Progress Notes (Signed)
[facility] PROGRESS NOTE  Kristin Jennings HTD:[id number] DOB: 1916-09-08 DOA: 10/26/2013 PCP: Ruben Reason, MD  Assessment/Plan  Sepsis likely secondary to CAP of RLL vs. UTI  -  Continue ceftriaxone and azithromycin day 3 -  blood cultures NGTD -  Urine legionella and S. pneumo neg -  UA neg  Dehydration suggested by hyponatremia, hypochloremia, elevated BUN:Cr, tachycardia, and elevated lactic acid.  Lactic acid trending down.   -  Continue IVF  Purulent-appearing urethral discharge -  UA neg -  Wet prep from discharge, neg for infection -  Urine GC/Ch pending  Acute urinary retention, may be related to UTI/dysuria -  Continue foley catheter  A-fib with RVR alternating with MAT and sinus rhythm with PACs.  Hypotensive with dilt gtt and when given metoprolol.  Was given digoxin overnight, but given her CKD stage IV, would not continue.  -  Start amiodarone gtt for rhythm control -  Continue IVF -  Continue ASA  Acute encephalopathy due to sepsis, resolving. -  Cont tx for infection and dehydration  HTN, blood pressure low due to sepsis, dehydration, and dilt - hold norvasc  Hypothyroidism, TSH wnl, continue synthroid  Severe protein calorie malnutrition -  Regular diet with supplements -  Nutrition consultation -  Consider appetite stimulant  Hyponatremia, likely secondary to dehydration and resolving with IVF -  Continue IVF -  TSH wnl -  Consider cortisol level -  Monitor for heart failure -  No evidence of cirrhosis  Leukocytosis, resolving with IVF and abx.  Microcytic anemia likely secondary to multiple myeloma.  Patient has been seen by oncology and she has declined further evaluation and treatment.  She does not want blood transfusions.   -  Minimize blood draws  Diet:  regular Access:  PIV IVF:  yes Proph:  heparin  Code Status: DNR Family Communication: patient alone Disposition Plan: pending improvement in heart rate.  Continue  stepdown until HR more stable, then will transition to tele for PO amio.  Consider palliative care consult for goals of care.  Currently, family would like to minimize time in hospital and get her home ASAP.    Consultants:  none  Procedures:  CXR  CT head  Antibiotics:  Ceftriaxone 2/1 >>  Azithromycin 2/1 >>   HPI/Subjective:  States she feels well.  Asymptomatic during tachycardia except for lightheadedness when hypotensive.      Objective: Filed Vitals:   10/28/13 0805 10/28/13 0900 10/28/13 1000 10/28/13 1100  BP:   115/59   Pulse:  53 40 36  Temp: 97.6 F (36.4 C)     TempSrc: Oral     Resp:  '22 21 20  ' Height:      Weight:      SpO2:  98% 99% 98%    Intake/Output Summary (Last 24 hours) at 10/28/13 1154 Last data filed at 10/28/13 1100  Gross per 24 hour  Intake 2779.44 ml  Output    900 ml  Net 1879.44 ml   Filed Weights   10/26/13 2015 10/28/13 0400  Weight: 46.2 kg (101 lb 13.6 oz) 49.2 kg (108 lb 7.5 oz)    Exam:   General:  Cachectic AAF, No acute distress  HEENT:  NCAT, MMM  Cardiovascular:  IRRR, nl S1, S2 2/6 systolic murmur LSB, 2+ pulses, warm extremities  Respiratory:  CTAB, no increased WOB  Abdomen:   NABS, soft, NT/ND  GU:  Vulva is swollen, no obvious discharge at this time but was just  cleaned up by nursing staff  MSK:   Normal tone and bulk, no LEE  Neuro:  Grossly intact, decreased muscle bulk  Data Reviewed: Basic Metabolic Panel:  Recent Labs Lab 10/26/13 1450 10/27/13 0336 10/28/13 0325  NA 129* 127* 130*  K 4.2 4.2 4.1  CL 92* 92* 98  CO2 '22 23 20  ' GLUCOSE 158* 126* 103*  BUN '19 22 18  ' CREATININE 0.91 0.87 0.79  CALCIUM 9.1 8.3* 7.9*   Liver Function Tests:  Recent Labs Lab 10/26/13 1450  AST 14  ALT 6  ALKPHOS 88  BILITOT 0.7  PROT 9.8*  ALBUMIN 2.5*   No results found for this basename: LIPASE, AMYLASE,  in the last 168 hours No results found for this basename: AMMONIA,  in the last 168  hours CBC:  Recent Labs Lab 10/26/13 1450 10/27/13 0336 10/28/13 0325  WBC 15.2* 11.6* 9.5  NEUTROABS 13.1*  --   --   HGB 9.7* 7.8* 7.4*  HCT 30.4* 25.2* 23.1*  MCV 75.8* 75.2* 75.0*  PLT 320 331 317   Cardiac Enzymes:  Recent Labs Lab 10/26/13 1601  TROPONINI <0.30   BNP (last 3 results)  Recent Labs  10/26/13 2142  PROBNP 2747.0*   CBG: No results found for this basename: GLUCAP,  in the last 168 hours  Recent Results (from the past 240 hour(s))  CULTURE, BLOOD (ROUTINE X 2)     Status: None   Collection Time    10/26/13  2:50 PM      Result Value Range Status   Specimen Description BLOOD RIGHT FOREARM  5 ML IN [facility] BOTTLE   Final   Special Requests NONE   Final   Culture  Setup Time     Final   Value: 10/26/2013 20:45     Performed at [facility]   Culture     Final   Value:        BLOOD CULTURE RECEIVED NO GROWTH TO DATE CULTURE WILL BE HELD FOR 5 DAYS BEFORE ISSUING A FINAL NEGATIVE REPORT     Performed at [facility]   Report Status PENDING   Incomplete  CULTURE, BLOOD (ROUTINE X 2)     Status: None   Collection Time    10/26/13  3:51 PM      Result Value Range Status   Specimen Description BLOOD RIGHT ARM   Final   Special Requests BOTTLES DRAWN AEROBIC AND ANAEROBIC [facility] EACH   Final   Culture  Setup Time     Final   Value: 10/26/2013 20:45     Performed at [facility]   Culture     Final   Value:        BLOOD CULTURE RECEIVED NO GROWTH TO DATE CULTURE WILL BE HELD FOR 5 DAYS BEFORE ISSUING A FINAL NEGATIVE REPORT     Performed at [facility]   Report Status PENDING   Incomplete  MRSA PCR SCREENING     Status: None   Collection Time    10/26/13  8:24 PM      Result Value Range Status   MRSA by PCR NEGATIVE  NEGATIVE Final   Comment:            The GeneXpert MRSA Assay (FDA     approved for NASAL specimens     only), is one component of a     comprehensive MRSA colonization     surveillance program. It is  not  intended to diagnose MRSA     infection nor to guide or     monitor treatment for     MRSA infections.  WET PREP, GENITAL     Status: Abnormal   Collection Time    10/27/13 11:15 AM      Result Value Range Status   Yeast Wet Prep HPF POC NONE SEEN  NONE SEEN Final   Trich, Wet Prep NONE SEEN  NONE SEEN Final   Clue Cells Wet Prep HPF POC NONE SEEN  NONE SEEN Final   WBC, Wet Prep HPF POC FEW (*) NONE SEEN Final     Studies: Dg Chest 2 View  10/26/2013   CLINICAL DATA:  Altered mental status  EXAM: CHEST  2 VIEW  COMPARISON:  CT CHEST W/CM dated 07/24/2013; CT CHEST W/CM dated 03/25/2013; DG CHEST 2V dated 03/25/2013  FINDINGS: The lungs are hyperinflated likely secondary to COPD. There is hazy right lower lobe airspace disease. There is no pleural effusion or pneumothorax. Stable mild cardiomegaly.  The osseous structures are unremarkable.  IMPRESSION: Hazy right lower lobe airspace disease which may represent atelectasis versus developing pneumonia.   Electronically Signed   By: Kathreen Devoid   On: 10/26/2013 15:45   Ct Head Wo Contrast  10/26/2013   CLINICAL DATA:  Post seizure  EXAM: CT HEAD WITHOUT CONTRAST  TECHNIQUE: Contiguous axial images were obtained from the base of the skull through the vertex without intravenous contrast.  COMPARISON:  None  FINDINGS: Generalized atrophy.  Normal ventricular morphology.  No midline shift or mass effect.  Low attenuation along the interhemispheric fissure 6 mm thick question lipoma falx.  No intracranial hemorrhage, additional mass lesion or evidence acute infarction.  No extra-axial fluid collections.  Bones appear demineralized.  No acute bone or sinus abnormalities.  IMPRESSION: Fat attenuation along the interhemispheric fissure question lipoma of the falx.  No acute intracranial abnormalities identified.   Electronically Signed   By: Lavonia Dana M.D.   On: 10/26/2013 15:20    Scheduled Meds: . aspirin  81 mg Oral BID  . azithromycin  500 mg  Oral Daily  . cefTRIAXone (ROCEPHIN)  IV  1 g Intravenous Q24H  . heparin  5,000 Units Subcutaneous Q8H  . lactose free nutrition  237 mL Oral TID WC  . levothyroxine  75 mcg Oral QAC breakfast  . pantoprazole  40 mg Oral Daily   Continuous Infusions: . sodium chloride 55 mL/hr at 10/27/13 0800  . amiodarone (NEXTERONE PREMIX) 360 mg/200 mL dextrose 60 mg/hr (10/28/13 1148)   Followed by  . amiodarone (NEXTERONE PREMIX) 360 mg/200 mL dextrose      Active Problems:   HTN (hypertension)   Hypothyroidism   CAP (community acquired pneumonia)   Sepsis   Atrial fibrillation with RVR   Leukocytosis   Dehydration   Acute encephalopathy   Protein calorie malnutrition   Protein-calorie malnutrition, severe    Time spent: 30 min    Lonette Stevison, [place] [facility] Pager [phone]. If 7PM-7AM, please contact night-coverage at [email or web address], password [facility] 10/28/2013, 11:54 AM  LOS: 2 days

## 2013-10-29 DIAGNOSIS — D649 Anemia, unspecified: Secondary | ICD-10-CM

## 2013-10-29 LAB — BASIC METABOLIC PANEL
BUN: 12 mg/dL (ref 6–23)
CALCIUM: 7.7 mg/dL — AB (ref 8.4–10.5)
CHLORIDE: 101 meq/L (ref 96–112)
CO2: 20 mEq/L (ref 19–32)
Creatinine, Ser: 0.71 mg/dL (ref 0.50–1.10)
GFR calc Af Amer: 82 mL/min — ABNORMAL LOW (ref >90)
GFR calc non Af Amer: 71 mL/min — ABNORMAL LOW (ref >90)
Glucose, Bld: 96 mg/dL (ref 70–99)
POTASSIUM: 4.1 meq/L (ref 3.7–5.3)
Sodium: 131 mEq/L — ABNORMAL LOW (ref 137–147)

## 2013-10-29 MED ORDER — AMIODARONE HCL 200 MG PO TABS
200.0000 mg | ORAL_TABLET | Freq: Two times a day (BID) | ORAL | Status: DC
  Administered 2013-10-29 – 2013-10-30 (×3): 200 mg via ORAL
  Filled 2013-10-29 (×4): qty 1

## 2013-10-29 MED ORDER — CEFPODOXIME PROXETIL 200 MG PO TABS
200.0000 mg | ORAL_TABLET | Freq: Two times a day (BID) | ORAL | Status: DC
  Administered 2013-10-29 – 2013-10-30 (×3): 200 mg via ORAL
  Filled 2013-10-29 (×4): qty 1

## 2013-10-29 NOTE — Plan of Care (Signed)
Foley d/c at 1630 today.

## 2013-10-29 NOTE — Plan of Care (Signed)
Problem: Phase I Progression Outcomes Goal: Hemodynamically stable Outcome: Completed/Met Date Met:  10/29/13 Amiodorone drip d/c. Started on po amiodorone.

## 2013-10-29 NOTE — Evaluation (Signed)
Occupational Therapy Evaluation Patient Details Name: Kristin Jennings MRN: [id number] DOB: [birth year] Today's Date: 10/29/2013 Time: 7371-0626 OT Time Calculation (min): 23 min  OT Assessment / Plan / Recommendation History of present illness Kristin Jennings is a 80 y.o. female with a past medical history of atrial fibrillation, hypertension, cognitive impairment, brought to the emergency department with mental status changes. Patient had told family members last Friday that she was not feeling well, over the weekend becoming progressively weaker, having minimal by mouth intake, less interactive, having an overall functional decline. This morning family members noted her to be increasingly confused and disoriented. family members also reported patient having a cough since November however has worsened over the last several days. Her daughter stated noticing "shaking" however there was no postictal period described by family members. In the emergency room she presented in atrial fibrillation with rapid ventricular response, having heart rates in the 120s and was started on Cardizem IV.  Chest x-ray showed findings suggestive of early pneumonia, with CBC showing an elevated white count in the 50,000 range.    Clinical Impression   Pt was admitted for the above.  She will benefit from skilled OT to increase safety and independence with adls.  Unsure about PLOF and if pt lives with daughter.  Goals in acute are for min A level overall.      OT Assessment  Patient needs continued OT Services    Follow Up Recommendations  Supervision/Assistance - 24 hour;SNF    Barriers to Discharge      Equipment Recommendations  3 in 1 bedside comode (if pt doesn't have)    Recommendations for Other Services    Frequency  Min 2X/week    Precautions / Restrictions Precautions Precautions: Fall Restrictions Weight Bearing Restrictions: No   Pertinent Vitals/Pain HR 80-120.  No c/o pain    ADL  Eating/Feeding: Set up Where Assessed - Eating/Feeding: Bed level (doesn't have teeth:  some spillage) Grooming: Wash/dry face;Set up Where Assessed - Grooming: Unsupported sitting Upper Body Bathing: Minimal assistance (lines) Where Assessed - Upper Body Bathing: Unsupported sitting Lower Body Bathing: Moderate assistance Where Assessed - Lower Body Bathing: Supported sit to stand Upper Body Dressing: Minimal assistance (lines) Where Assessed - Upper Body Dressing: Unsupported sitting Lower Body Dressing: Moderate assistance Where Assessed - Lower Body Dressing: Supported sit to Lobbyist: Moderate assistance Toilet Transfer Method: Stand pivot (for sit to stand) Toilet Transfer Equipment: Bedside commode Toileting - Clothing Manipulation and Hygiene: +1 Total assistance Where Assessed - Toileting Clothing Manipulation and Hygiene: Sit to stand from 3-in-1 or toilet Equipment Used: Rolling walker Transfers/Ambulation Related to ADLs: spt to 3:1 then ambulated a few steps to chair, +2 for safety/lines ADL Comments: Pt needs mod A for sit to stand with adls; also needs assist with tasks for LB    OT Diagnosis: Generalized weakness  OT Problem List: Decreased strength;Decreased activity tolerance;Impaired balance (sitting and/or standing);Decreased knowledge of use of DME or AE;Decreased safety awareness;Decreased cognition OT Treatment Interventions: Self-care/ADL training;DME and/or AE instruction;Patient/family education;Balance training   OT Goals(Current goals can be found in the care plan section) Acute Rehab OT Goals Patient Stated Goal: get to bathroom OT Goal Formulation: With patient Time For Goal Achievement: 11/12/13 Potential to Achieve Goals: Good ADL Goals Pt Will Transfer to Toilet: with min assist;ambulating;bedside commode Pt Will Perform Toileting - Clothing Manipulation and hygiene: with min guard assist;sit to/from stand Additional ADL Goal #1: pt will  perform lb adls with  min A, sit to stand Additional ADL Goal #2: pt not need cues for safety during adls/toilet transfer  Visit Information  Last OT Received On: 10/29/13 Assistance Needed: +2 (ambulation) PT/OT/SLP Co-Evaluation/Treatment: Yes Reason for Co-Treatment: For patient/therapist safety PT goals addressed during session: Mobility/safety with mobility OT goals addressed during session: ADL's and self-care History of Present Illness: Kristin Jennings is a 80 y.o. female with a past medical history of atrial fibrillation, hypertension, cognitive impairment, brought to the emergency department with mental status changes. Patient had told family members last Friday that she was not feeling well, over the weekend becoming progressively weaker, having minimal by mouth intake, less interactive, having an overall functional decline. This morning family members noted her to be increasingly confused and disoriented. family members also reported patient having a cough since November however has worsened over the last several days. Her daughter stated noticing "shaking" however there was no postictal period described by family members. In the emergency room she presented in atrial fibrillation with rapid ventricular response, having heart rates in the 120s and was started on Cardizem IV.  Chest x-ray showed findings suggestive of early pneumonia, with CBC showing an elevated white count in the 50,000 range.        Prior [place] expects to be discharged to:: Private residence Available Help at Discharge: Family Type of Home: House Prior Function Level of Independence: Independent with assistive device(s);Needs assistance Communication Communication: No difficulties         Vision/Perception     Cognition  Cognition Arousal/Alertness: Awake/alert Behavior During Therapy: WFL for tasks assessed/performed Overall Cognitive Status: No  family/caregiver present to determine baseline cognitive functioning    Extremity/Trunk Assessment Upper Extremity Assessment Upper Extremity Assessment: Generalized weakness     Mobility Bed Mobility Overal bed mobility: Needs Assistance Bed Mobility: Supine to Sit Supine to sit: Mod assist;+2 for safety/equipment General bed mobility comments: assistance getting legs over edge, cues to scoot to edge after assisted to sitting. Transfers Overall transfer level: Needs assistance Equipment used: Rolling walker (2 wheeled);1 person hand held assist Transfers: Sit to/from Stand Sit to Stand: +2 safety/equipment;Mod assist Stand pivot transfers: Mod assist General transfer comment: maintained standing balance once up with RW     Exercise     Balance Balance Overall balance assessment: Needs assistance Sitting-balance support: No upper extremity supported;Feet supported Sitting balance-Leahy Scale: Good Standing balance support: Bilateral upper extremity supported Standing balance-Leahy Scale: Fair Standing balance comment: with RW   End of Session OT - End of Session Activity Tolerance: Patient tolerated treatment well Patient left: in chair;with call bell/phone within reach  [place] 10/29/2013, 10:47 AM Lesle Chris, OTR/L [phone] 10/29/2013

## 2013-10-29 NOTE — Progress Notes (Signed)
[facility] PROGRESS NOTE  Kristin Jennings EHO:[id number] DOB: Nov 12, 1915 DOA: 10/26/2013 PCP: Ruben Reason, MD  Assessment/Plan: Sepsis likely secondary to CAP of RLL - Continue ceftriaxone and azithromycin  - blood cultures with no growth - UA cx with no growth - Leukocytosis resolved - Afebrile  Dehydration suggested by hyponatremia, hypochloremia, elevated BUN:Cr, tachycardia, and elevated lactic acid  - Continue IVF  - LA trending down  Purulent-appearing urethral discharge  - UA neg - Wet prep from discharge with few wbc's , otherwise unremarkable  Acute urinary retention, may be related to UTI/dysuria  - Foley catheter   MAT and possibly some intermittent A-fib. Has known hx of paroxysmal a-fib per notes. - D/c'd dilt  - On amiodarone gtt - Consider transition to PO - Continue IVF  - Continue ASA   Acute encephalopathy due to sepsis, resolving.  - Cont tx for infection and dehydration   HTN, blood pressure low due to sepsis, dehydration, and dilt  - D/c dilt  - hold norvasc   Hypothyroidism, TSH wnl, continue synthroid   Severe protein calorie malnutrition  - Regular diet with supplements  - Nutrition consultation  - Consider appetite stimulant   Hyponatremia, likely secondary to dehydration  - Continue IVF - Improving - TSH wnl  - Consider cortisol level  - Monitor for heart failure  - No evidence of cirrhosis   Leukocytosis, resolving with IVF and abx.  - Repeat in AM   Microcytic anemia likely secondary to multiple myeloma. Patient has been seen by oncology and she has declined further evaluation and treatment. She does not want blood transfusions.  - Minimize blood draws  Code Status: DNR Family Communication: Pt in room (indicate person spoken with, relationship, and if by phone, the number) Disposition Plan: pending   Consultants:  none  Procedures:    Antibiotics: Ceftriaxone 2/1 >>  Azithromycin 2/1 >>    HPI/Subjective: No acute events noted. Pt reports feeling better.  Objective: Filed Vitals:   10/29/13 0006 10/29/13 0200 10/29/13 0400 10/29/13 0600  BP:  1'11/55 99/54 97/40 '  Pulse:  82 73 80  Temp:   99.1 F (37.3 C)   TempSrc:   Oral   Resp:  '20 21 20  ' Height:      Weight:      SpO2: 94% 94% 92% 91%    Intake/Output Summary (Last 24 hours) at 10/29/13 0804 Last data filed at 10/29/13 0600  Gross per 24 hour  Intake 2164.84 ml  Output   2800 ml  Net -635.16 ml   Filed Weights   10/26/13 2015 10/28/13 0400  Weight: 46.2 kg (101 lb 13.6 oz) 49.2 kg (108 lb 7.5 oz)    Exam:   General:  Awake, in nad  Cardiovascular: regular, s1, s2  Respiratory: normal resp effort, no wheezing  Abdomen: soft, nondistended  Musculoskeletal: perfused, no clubbing   Data Reviewed: Basic Metabolic Panel:  Recent Labs Lab 10/26/13 1450 10/27/13 0336 10/28/13 0325 10/29/13 0325  NA 129* 127* 130* 131*  K 4.2 4.2 4.1 4.1  CL 92* 92* 98 101  CO2 '22 23 20 20  ' GLUCOSE 158* 126* 103* 96  BUN '19 22 18 12  ' CREATININE 0.91 0.87 0.79 0.71  CALCIUM 9.1 8.3* 7.9* 7.7*   Liver Function Tests:  Recent Labs Lab 10/26/13 1450  AST 14  ALT 6  ALKPHOS 88  BILITOT 0.7  PROT 9.8*  ALBUMIN 2.5*   No results found for this basename: LIPASE, AMYLASE,  in  the last 168 hours No results found for this basename: AMMONIA,  in the last 168 hours CBC:  Recent Labs Lab 10/26/13 1450 10/27/13 0336 10/28/13 0325  WBC 15.2* 11.6* 9.5  NEUTROABS 13.1*  --   --   HGB 9.7* 7.8* 7.4*  HCT 30.4* 25.2* 23.1*  MCV 75.8* 75.2* 75.0*  PLT 320 331 317   Cardiac Enzymes:  Recent Labs Lab 10/26/13 1601  TROPONINI <0.30   BNP (last 3 results)  Recent Labs  10/26/13 2142  PROBNP 2747.0*   CBG: No results found for this basename: GLUCAP,  in the last 168 hours  Recent Results (from the past 240 hour(s))  CULTURE, BLOOD (ROUTINE X 2)     Status: None   Collection Time     10/26/13  2:50 PM      Result Value Range Status   Specimen Description BLOOD RIGHT FOREARM  5 ML IN [facility] BOTTLE   Final   Special Requests NONE   Final   Culture  Setup Time     Final   Value: 10/26/2013 20:45     Performed at [facility]   Culture     Final   Value:        BLOOD CULTURE RECEIVED NO GROWTH TO DATE CULTURE WILL BE HELD FOR 5 DAYS BEFORE ISSUING A FINAL NEGATIVE REPORT     Performed at [facility]   Report Status PENDING   Incomplete  CULTURE, BLOOD (ROUTINE X 2)     Status: None   Collection Time    10/26/13  3:51 PM      Result Value Range Status   Specimen Description BLOOD RIGHT ARM   Final   Special Requests BOTTLES DRAWN AEROBIC AND ANAEROBIC [facility] EACH   Final   Culture  Setup Time     Final   Value: 10/26/2013 20:45     Performed at [facility]   Culture     Final   Value:        BLOOD CULTURE RECEIVED NO GROWTH TO DATE CULTURE WILL BE HELD FOR 5 DAYS BEFORE ISSUING A FINAL NEGATIVE REPORT     Performed at [facility]   Report Status PENDING   Incomplete  MRSA PCR SCREENING     Status: None   Collection Time    10/26/13  8:24 PM      Result Value Range Status   MRSA by PCR NEGATIVE  NEGATIVE Final   Comment:            The GeneXpert MRSA Assay (FDA     approved for NASAL specimens     only), is one component of a     comprehensive MRSA colonization     surveillance program. It is not     intended to diagnose MRSA     infection nor to guide or     monitor treatment for     MRSA infections.  WET PREP, GENITAL     Status: Abnormal   Collection Time    10/27/13 11:15 AM      Result Value Range Status   Yeast Wet Prep HPF POC NONE SEEN  NONE SEEN Final   Trich, Wet Prep NONE SEEN  NONE SEEN Final   Clue Cells Wet Prep HPF POC NONE SEEN  NONE SEEN Final   WBC, Wet Prep HPF POC FEW (*) NONE SEEN Final  URINE CULTURE     Status: None   Collection  Time    10/27/13  3:10 PM      Result Value Range Status   Specimen  Description URINE, CATHETERIZED   Final   Special Requests NONE   Final   Culture  Setup Time     Final   Value: 10/27/2013 22:11     Performed at [facility]     Final   Value: NO GROWTH     Performed at [facility]   Culture     Final   Value: NO GROWTH     Performed at [facility]   Report Status 10/28/2013 FINAL   Final     Studies: No results found.  Scheduled Meds: . aspirin  81 mg Oral BID  . cefTRIAXone (ROCEPHIN)  IV  1 g Intravenous Q24H  . heparin  5,000 Units Subcutaneous Q8H  . lactose free nutrition  237 mL Oral TID WC  . levothyroxine  75 mcg Oral QAC breakfast  . pantoprazole  40 mg Oral Daily   Continuous Infusions: . sodium chloride 55 mL/hr at 10/27/13 0800  . amiodarone (NEXTERONE PREMIX) 360 mg/200 mL dextrose 30 mg/hr (10/29/13 0542)    Active Problems:   HTN (hypertension)   Hypothyroidism   CAP (community acquired pneumonia)   Sepsis   Atrial fibrillation with RVR   Leukocytosis   Dehydration   Acute encephalopathy   Protein calorie malnutrition   Protein-calorie malnutrition, severe  Time spent: 77mn   [facility], [facility] Pager [phone] If 7PM-7AM, please contact night-coverage at [email or web address], password [email or web address]2/12/2013, 8:04 AM  LOS: 3 days

## 2013-10-29 NOTE — Progress Notes (Signed)
Physical Therapy Treatment Patient Details Name: Kristin Jennings MRN: [id number] DOB: 08-30-1916 Today's Date: 10/29/2013 Time: 6270-3500 PT Time Calculation (min): 26 min  PT Assessment / Plan / Recommendation  History of Present Illness Kristin Jennings is a 80 y.o. female with a past medical history of atrial fibrillation, hypertension, cognitive impairment, brought to the emergency department with mental status changes. Patient had told family members last Friday that she was not feeling well, over the weekend becoming progressively weaker, having minimal by mouth intake, less interactive, having an overall functional decline. This morning family members noted her to be increasingly confused and disoriented. family members also reported patient having a cough since November however has worsened over the last several days. Her daughter stated noticing "shaking" however there was no postictal period described by family members. In the emergency room she presented in atrial fibrillation with rapid ventricular response, having heart rates in the 120s and was started on Cardizem IV.  Chest x-ray showed findings suggestive of early pneumonia, with CBC showing an elevated white count in the 50,000 range.    PT Comments   Pt very pleasant and mobilized to [facility] then recliner per OK with RN. Pt tolerated well. No family present. Recommend that pt have 24/7 caregivers. Pt indicated that grandaughter stays with pt but no 24/7.   Follow Up Recommendations  Home health PT;Supervision/Assistance - 24 hour     Does the patient have the potential to tolerate intense rehabilitation     Barriers to Discharge        Equipment Recommendations  None recommended by PT    Recommendations for Other Services    Frequency Min 3X/week   Progress towards PT Goals Progress towards PT goals: Progressing toward goals  Plan Current plan remains appropriate    Precautions / Restrictions Precautions Precautions:  Fall   Pertinent Vitals/Pain HR 90's with jumps to 130's at times, not sustained.    Mobility  Bed Mobility Overal bed mobility: Needs Assistance Bed Mobility: Supine to Sit Supine to sit: Mod assist;+2 for safety/equipment General bed mobility comments: assistance getting legs over edge, cues to scoot to edge after assisted to sitting. Transfers Overall transfer level: Needs assistance Equipment used: Rolling walker (2 wheeled);1 person hand held assist Transfers: Sit to/from [facility] Sit to Stand: +2 safety/equipment;Mod assist Stand pivot transfers: Mod assist General transfer comment: pt stood much better w/ RW before her. able to transfer steps to [facility]. Stood from [facility] with cues for hand placement. Pt  turned around aboout 180 * turn to get to recliner, Multimodal cues to back up and hand placement. Ambulation/Gait Ambulation/Gait assistance: Mod assist;+2 safety/equipment Ambulation Distance (Feet): 5 Feet Assistive device: Rolling walker (2 wheeled)    Exercises     PT Diagnosis:    PT Problem List:   PT Treatment Interventions:     PT Goals (current goals can now be found in the care plan section)    Visit Information  Last PT Received On: 10/29/13 Assistance Needed: +2 PT/OT/SLP Co-Evaluation/Treatment: Yes Reason for Co-Treatment: For patient/therapist safety PT goals addressed during session: Mobility/safety with mobility History of Present Illness: Kristin Jennings is a 80 y.o. female with a past medical history of atrial fibrillation, hypertension, cognitive impairment, brought to the emergency department with mental status changes. Patient had told family members last Friday that she was not feeling well, over the weekend becoming progressively weaker, having minimal by mouth intake, less interactive, having an overall functional decline. This  morning family members noted her to be increasingly confused and disoriented. family members also  reported patient having a cough since November however has worsened over the last several days. Her daughter stated noticing "shaking" however there was no postictal period described by family members. In the emergency room she presented in atrial fibrillation with rapid ventricular response, having heart rates in the 120s and was started on Cardizem IV.  Chest x-ray showed findings suggestive of early pneumonia, with CBC showing an elevated white count in the 50,000 range.     Subjective Data      Cognition  Cognition Arousal/Alertness: Awake/alert Behavior During Therapy: WFL for tasks assessed/performed    Balance  Balance Overall balance assessment: Needs assistance Sitting-balance support: No upper extremity supported;Feet supported Sitting balance-Leahy Scale: Good Standing balance support: Bilateral upper extremity supported Standing balance-Leahy Scale: Fair Standing balance comment: stands with support of RW.  End of Session PT - End of Session Activity Tolerance: Patient tolerated treatment well Patient left: in chair;with call bell/phone within reach Nurse Communication: Mobility status (BM)   GP     Claretha Cooper 10/29/2013, 9:44 AM Tresa Endo PT [phone]

## 2013-10-29 NOTE — Progress Notes (Signed)
[id number] Kristin Hoes, RN, BSN, CCM, [phone] Chart reviewed for update of needs and condition. Continues to require iv aminodarone for a. Fib with rvr, na 131, temp 99.2, bp low 97/40 high 146/100. o2 sats drop during sleep to 73% but then are at 100% awake. Patient is age 80/live at home.

## 2013-10-30 DIAGNOSIS — E871 Hypo-osmolality and hyponatremia: Secondary | ICD-10-CM

## 2013-10-30 DIAGNOSIS — R0989 Other specified symptoms and signs involving the circulatory and respiratory systems: Secondary | ICD-10-CM

## 2013-10-30 DIAGNOSIS — R0609 Other forms of dyspnea: Secondary | ICD-10-CM

## 2013-10-30 DIAGNOSIS — D649 Anemia, unspecified: Secondary | ICD-10-CM

## 2013-10-30 DIAGNOSIS — J90 Pleural effusion, not elsewhere classified: Secondary | ICD-10-CM

## 2013-10-30 LAB — BASIC METABOLIC PANEL
BUN: 10 mg/dL (ref 6–23)
CO2: 19 mEq/L (ref 19–32)
Calcium: 7.7 mg/dL — ABNORMAL LOW (ref 8.4–10.5)
Chloride: 100 mEq/L (ref 96–112)
Creatinine, Ser: 0.74 mg/dL (ref 0.50–1.10)
GFR calc Af Amer: 81 mL/min — ABNORMAL LOW (ref >90)
GFR, EST NON AFRICAN AMERICAN: 70 mL/min — AB (ref >90)
Glucose, Bld: 96 mg/dL (ref 70–99)
Potassium: 3.7 mEq/L (ref 3.7–5.3)
Sodium: 129 mEq/L — ABNORMAL LOW (ref 137–147)

## 2013-10-30 MED ORDER — BOOST PLUS PO LIQD: 237.0000 mL | Freq: Three times a day (TID) | ORAL | Status: AC

## 2013-10-30 MED ORDER — AMIODARONE HCL 200 MG PO TABS: 200.0000 mg | ORAL_TABLET | Freq: Two times a day (BID) | ORAL | Status: AC

## 2013-10-30 MED ORDER — AZITHROMYCIN 500 MG PO TABS: 500.0000 mg | ORAL_TABLET | Freq: Every day | ORAL | Status: DC

## 2013-10-30 MED ORDER — CEFPODOXIME PROXETIL 200 MG PO TABS: 200.0000 mg | ORAL_TABLET | Freq: Two times a day (BID) | ORAL | Status: DC

## 2013-10-30 NOTE — Discharge Summary (Signed)
Physician Discharge Summary  Kristin Jennings XNA:[id number] DOB: 04/18/1916 DOA: 10/26/2013  PCP: Ruben Reason, MD  Admit date: 10/26/2013 Discharge date: 10/30/2013  Time spent: 40 minutes  Recommendations for Outpatient Follow-up:  1. Follow up with PCP in 1-2 weeks 2. Consider repeat basic metabolic panel in 1 week, paying attention to sodium 3. Titrate amiodarone as needed to maintain rate control  Discharge Diagnoses:  Active Problems:   HTN (hypertension)   Hypothyroidism   CAP (community acquired pneumonia)   Sepsis   Atrial fibrillation with RVR   Leukocytosis   Dehydration   Acute encephalopathy   Protein calorie malnutrition   Protein-calorie malnutrition, severe   Discharge Condition: Stable  Diet recommendation: Regular  Filed Weights   10/26/13 2015 10/28/13 0400  Weight: 46.2 kg (101 lb 13.6 oz) 49.2 kg (108 lb 7.5 oz)    History of present illness:  Kristin Jennings is a 80 y.o. female with a past medical history of atrial fibrillation, hypertension, cognitive impairment, brought to the emergency department with mental status changes. Patient had told family members last Friday that she was not feeling well, over the weekend becoming progressively weaker, having minimal by mouth intake, less interactive, having an overall functional decline. This morning family members noted her to be increasingly confused and disoriented. family members also reported patient having a cough since November however has worsened over the last several days. Her daughter stated noticing "shaking" however there was no postictal period described by family members. In the emergency room she presented in atrial fibrillation with rapid ventricular response, having heart rates in the 120s and was started on Cardizem IV. Chest x-ray showed findings suggestive of early pneumonia, with CBC showing an elevated white count in the 50,000 range. She was started on empiric IV antibiotic therapy  with ceftriaxone and azithromycin.   Hospital Course:  Sepsis likely secondary to CAP of RLL  - Cont Vantin with azithromycin orally - blood cultures with no growth  - UA cx with no growth  - Leukocytosis resolved  - Afebrile  Dehydration suggested by hyponatremia, hypochloremia, elevated BUN:Cr, tachycardia, and elevated lactic acid  - Continued on IVF  - LA trending down  - Pt is tolerating oral intake - Consider repeating bmet within one week Purulent-appearing urethral discharge  - UA neg  - Wet prep from discharge with few wbc's , otherwise unremarkable  Acute urinary retention - Foley catheter - bladder scan after trial of d/cing foley at over 700 - Recommend continuing indwelling folety cath with flomax and  MAT and possibly some intermittent A-fib. Has known hx of paroxysmal a-fib per notes.  - D/c'd dilt  - Was on amiodarone gtt, since transitioned to PO  - Continued on IVF  - Continue ASA  Acute encephalopathy due to sepsis, resolving.  - Cont tx for infection and dehydration  HTN, blood pressure low due to sepsis, dehydration, and dilt  - D/c'd dilt  - held norvasc  Hypothyroidism, TSH wnl, continue synthroid  Severe protein calorie malnutrition  - Regular diet with supplements  - Nutrition consultation  - Consider appetite stimulant  Hyponatremia, likely secondary to dehydration, appears chronic since at least this past fall per prior records - Continued IVF  - TSH wnl  - No evidence of cirrhosis  Leukocytosis, resolving with IVF and abx.  - Resolved Microcytic anemia likely secondary to multiple myeloma. Patient has been seen by oncology and she has declined further evaluation and treatment. She does not want blood transfusions.  -  Minimize blood draws  Discharge Exam: Filed Vitals:   10/30/13 0330 10/30/13 0419 10/30/13 0626 10/30/13 0800  BP:  101/68 107/70   Pulse: 131 59 83   Temp:  98.4 F (36.9 C)  98.2 F (36.8 C)  TempSrc:  Oral  Oral  Resp:  _0 Height:      Weight:      SpO2: 96% 99% 98%     General: awake, in nad Cardiovascular: regular, s1, s2 Respiratory: normal resp effort, no wheezing  Discharge Instructions     Medication List    ASK your doctor about these medications       amLODipine 5 MG tablet  Commonly known as:  NORVASC  TAKE 1 TABLET BY MOUTH EVERY DAY     aspirin 81 MG tablet  Take 81 mg by mouth 2 (two) times daily.     levothyroxine 75 MCG tablet  Commonly known as:  SYNTHROID, LEVOTHROID  TAKE 1 TABLET BY MOUTH EVERY DAY     polyethylene glycol packet  Commonly known as:  MIRALAX / GLYCOLAX  Take 17 g by mouth daily as needed for moderate constipation.       Allergies  Allergen Reactions  . Lisinopril Cough      The results of significant diagnostics from this hospitalization (including imaging, microbiology, ancillary and laboratory) are listed below for reference.    Significant Diagnostic Studies: Dg Chest 2 View  10/26/2013   CLINICAL DATA:  Altered mental status  EXAM: CHEST  2 VIEW  COMPARISON:  CT CHEST W/CM dated 07/24/2013; CT CHEST W/CM dated 03/25/2013; DG CHEST 2V dated 03/25/2013  FINDINGS: The lungs are hyperinflated likely secondary to COPD. There is hazy right lower lobe airspace disease. There is no pleural effusion or pneumothorax. Stable mild cardiomegaly.  The osseous structures are unremarkable.  IMPRESSION: Hazy right lower lobe airspace disease which may represent atelectasis versus developing pneumonia.   Electronically Signed   By: Kathreen Devoid   On: 10/26/2013 15:45   Ct Head Wo Contrast  10/26/2013   CLINICAL DATA:  Post seizure  EXAM: CT HEAD WITHOUT CONTRAST  TECHNIQUE: Contiguous axial images were obtained from the base of the skull through the vertex without intravenous contrast.  COMPARISON:  None  FINDINGS: Generalized atrophy.  Normal ventricular morphology.  No midline shift or mass effect.  Low attenuation along the interhemispheric fissure 6 mm thick  question lipoma falx.  No intracranial hemorrhage, additional mass lesion or evidence acute infarction.  No extra-axial fluid collections.  Bones appear demineralized.  No acute bone or sinus abnormalities.  IMPRESSION: Fat attenuation along the interhemispheric fissure question lipoma of the falx.  No acute intracranial abnormalities identified.   Electronically Signed   By: Lavonia Dana M.D.   On: 10/26/2013 15:20    Microbiology: Recent Results (from the past 240 hour(s))  CULTURE, BLOOD (ROUTINE X 2)     Status: None   Collection Time    10/26/13  2:50 PM      Result Value Range Status   Specimen Description BLOOD RIGHT FOREARM  5 ML IN [facility] BOTTLE   Final   Special Requests NONE   Final   Culture  Setup Time     Final   Value: 10/26/2013 20:45     Performed at [facility]   Culture     Final   Value:        BLOOD CULTURE RECEIVED NO GROWTH TO DATE CULTURE WILL  BE HELD FOR 5 DAYS BEFORE ISSUING A FINAL NEGATIVE REPORT     Performed at [facility]   Report Status PENDING   Incomplete  CULTURE, BLOOD (ROUTINE X 2)     Status: None   Collection Time    10/26/13  3:51 PM      Result Value Range Status   Specimen Description BLOOD RIGHT ARM   Final   Special Requests BOTTLES DRAWN AEROBIC AND ANAEROBIC [facility] EACH   Final   Culture  Setup Time     Final   Value: 10/26/2013 20:45     Performed at [facility]   Culture     Final   Value:        BLOOD CULTURE RECEIVED NO GROWTH TO DATE CULTURE WILL BE HELD FOR 5 DAYS BEFORE ISSUING A FINAL NEGATIVE REPORT     Performed at [facility]   Report Status PENDING   Incomplete  MRSA PCR SCREENING     Status: None   Collection Time    10/26/13  8:24 PM      Result Value Range Status   MRSA by PCR NEGATIVE  NEGATIVE Final   Comment:            The GeneXpert MRSA Assay (FDA     approved for NASAL specimens     only), is one component of a     comprehensive MRSA colonization     surveillance program. It is  not     intended to diagnose MRSA     infection nor to guide or     monitor treatment for     MRSA infections.  WET PREP, GENITAL     Status: Abnormal   Collection Time    10/27/13 11:15 AM      Result Value Range Status   Yeast Wet Prep HPF POC NONE SEEN  NONE SEEN Final   Trich, Wet Prep NONE SEEN  NONE SEEN Final   Clue Cells Wet Prep HPF POC NONE SEEN  NONE SEEN Final   WBC, Wet Prep HPF POC FEW (*) NONE SEEN Final  URINE CULTURE     Status: None   Collection Time    10/27/13  3:10 PM      Result Value Range Status   Specimen Description URINE, CATHETERIZED   Final   Special Requests NONE   Final   Culture  Setup Time     Final   Value: 10/27/2013 22:11     Performed at [facility]     Final   Value: NO GROWTH     Performed at [facility]   Culture     Final   Value: NO GROWTH     Performed at [facility]   Report Status 10/28/2013 FINAL   Final     Labs: Basic Metabolic Panel:  Recent Labs Lab 10/26/13 1450 10/27/13 0336 10/28/13 0325 10/29/13 0325 10/30/13 0310  NA 129* 127* 130* 131* 129*  Jennings 4.2 4.2 4.1 4.1 3.7  CL 92* 92* 98 101 100  CO2 _0 GLUCOSE 158* 126* 103* 96 96  BUN _1 CREATININE 0.91 0.87 0.79 0.71 0.74  CALCIUM 9.1 8.3* 7.9* 7.7* 7.7*   Liver Function Tests:  Recent Labs Lab 10/26/13 1450  AST 14  ALT 6  ALKPHOS 88  BILITOT 0.7  PROT 9.8*  ALBUMIN 2.5*  No results found for this basename: LIPASE, AMYLASE,  in the last 168 hours No results found for this basename: AMMONIA,  in the last 168 hours CBC:  Recent Labs Lab 10/26/13 1450 10/27/13 0336 10/28/13 0325  WBC 15.2* 11.6* 9.5  NEUTROABS 13.1*  --   --   HGB 9.7* 7.8* 7.4*  HCT 30.4* 25.2* 23.1*  MCV 75.8* 75.2* 75.0*  PLT 320 331 317   Cardiac Enzymes:  Recent Labs Lab 10/26/13 1601  TROPONINI <0.30   BNP: BNP (last 3 results)  Recent Labs  10/26/13 2142  PROBNP 2747.0*   CBG: No  results found for this basename: GLUCAP,  in the last 168 hours   Signed:  Kaelynn Igo Jennings  [facility] 10/30/2013, 8:29 AM

## 2013-10-31 ENCOUNTER — Telehealth: Payer: Self-pay

## 2013-10-31 NOTE — Telephone Encounter (Signed)
LMOM for Kim to return call. TL and I are confused on which doctor here should write orders. Pt hasnt been seen in months. Just looking to get more specific information.

## 2013-10-31 NOTE — Telephone Encounter (Signed)
Betsy from [facility] was in our office. We discussed with her and she will be taking care of this for [place]. She will also contact pt and Kim at [facility].

## 2013-10-31 NOTE — Telephone Encounter (Signed)
[facility]in [facility] called stating pt was discharged from the hospital with orders for pt and ot. Pt is cathederized and the family needs help with that care. [facility] requests orders for home nursing visits. Kristin Jennings states because the pt has been discharged, the hospital will not place the order.   Kristin Jennings [phone]

## 2013-11-01 ENCOUNTER — Telehealth: Payer: Self-pay

## 2013-11-01 LAB — CULTURE, BLOOD (ROUTINE X 2)
Culture: NO GROWTH
Culture: NO GROWTH

## 2013-11-01 NOTE — Telephone Encounter (Signed)
Kristin [facility] from [facility] calling to let dr hopper know that patients azithromycin is reacting with her amiodarone severity of one [phone]

## 2013-11-02 NOTE — Telephone Encounter (Signed)
Daughter would like to know how to obtain a DO NOT RESUSCITATE form.   Also, would like Dr. Linna Darner to call  Her on Monday.   [phone]

## 2013-11-03 ENCOUNTER — Telehealth: Payer: Self-pay

## 2013-11-03 NOTE — Telephone Encounter (Signed)
Advised daughter to speak with a Education officer, museum to get help with getting one filled out. Daughter states they want to bring the patient into our office for a follow up. She prefers to address this matter with Dr Linna Darner during a visit.

## 2013-11-03 NOTE — Telephone Encounter (Signed)
Spoke to the patient's granddaughter. They apparently planning to play her in some time next week.

## 2013-11-03 NOTE — Telephone Encounter (Signed)
[place] MOM IS AT A LIVING WILL PLACE AND THEY NEED TO HAVE A LIVING WILL FILLED OUT BY THE DR, DIDN'T KNOW HOW TO [place] ABOUT DOING IT PLEASE CALL [phone]

## 2013-11-08 ENCOUNTER — Inpatient Hospital Stay (HOSPITAL_COMMUNITY)
Admission: EM | Admit: 2013-11-08 | Discharge: 2013-11-13 | DRG: 292 | Disposition: A | Discharge status: Hospice | Payer: Medicare Other | Attending: Internal Medicine | Admitting: Internal Medicine

## 2013-11-08 ENCOUNTER — Emergency Department (HOSPITAL_COMMUNITY): Payer: Medicare Other

## 2013-11-08 ENCOUNTER — Encounter (HOSPITAL_COMMUNITY): Payer: Self-pay | Admitting: Emergency Medicine

## 2013-11-08 DIAGNOSIS — I3139 Other pericardial effusion (noninflammatory): Secondary | ICD-10-CM | POA: Diagnosis present

## 2013-11-08 DIAGNOSIS — Z66 Do not resuscitate: Secondary | ICD-10-CM | POA: Diagnosis present

## 2013-11-08 DIAGNOSIS — R531 Weakness: Secondary | ICD-10-CM

## 2013-11-08 DIAGNOSIS — D472 Monoclonal gammopathy: Secondary | ICD-10-CM

## 2013-11-08 DIAGNOSIS — I4891 Unspecified atrial fibrillation: Secondary | ICD-10-CM | POA: Diagnosis present

## 2013-11-08 DIAGNOSIS — D649 Anemia, unspecified: Secondary | ICD-10-CM

## 2013-11-08 DIAGNOSIS — K7689 Other specified diseases of liver: Secondary | ICD-10-CM

## 2013-11-08 DIAGNOSIS — J90 Pleural effusion, not elsewhere classified: Secondary | ICD-10-CM

## 2013-11-08 DIAGNOSIS — Z7982 Long term (current) use of aspirin: Secondary | ICD-10-CM

## 2013-11-08 DIAGNOSIS — I5033 Acute on chronic diastolic (congestive) heart failure: Principal | ICD-10-CM | POA: Diagnosis present

## 2013-11-08 DIAGNOSIS — D509 Iron deficiency anemia, unspecified: Secondary | ICD-10-CM

## 2013-11-08 DIAGNOSIS — R627 Adult failure to thrive: Secondary | ICD-10-CM | POA: Diagnosis present

## 2013-11-08 DIAGNOSIS — I509 Heart failure, unspecified: Secondary | ICD-10-CM

## 2013-11-08 DIAGNOSIS — I313 Pericardial effusion (noninflammatory): Secondary | ICD-10-CM | POA: Diagnosis present

## 2013-11-08 DIAGNOSIS — I272 Pulmonary hypertension, unspecified: Secondary | ICD-10-CM

## 2013-11-08 DIAGNOSIS — I10 Essential (primary) hypertension: Secondary | ICD-10-CM | POA: Diagnosis present

## 2013-11-08 DIAGNOSIS — E039 Hypothyroidism, unspecified: Secondary | ICD-10-CM | POA: Diagnosis present

## 2013-11-08 DIAGNOSIS — E871 Hypo-osmolality and hyponatremia: Secondary | ICD-10-CM

## 2013-11-08 DIAGNOSIS — I319 Disease of pericardium, unspecified: Secondary | ICD-10-CM | POA: Diagnosis present

## 2013-11-08 DIAGNOSIS — C9000 Multiple myeloma not having achieved remission: Secondary | ICD-10-CM | POA: Diagnosis present

## 2013-11-08 DIAGNOSIS — R918 Other nonspecific abnormal finding of lung field: Secondary | ICD-10-CM

## 2013-11-08 DIAGNOSIS — Z79899 Other long term (current) drug therapy: Secondary | ICD-10-CM

## 2013-11-08 DIAGNOSIS — K769 Liver disease, unspecified: Secondary | ICD-10-CM

## 2013-11-08 DIAGNOSIS — R1310 Dysphagia, unspecified: Secondary | ICD-10-CM

## 2013-11-08 DIAGNOSIS — I2789 Other specified pulmonary heart diseases: Secondary | ICD-10-CM | POA: Diagnosis present

## 2013-11-08 DIAGNOSIS — R0600 Dyspnea, unspecified: Secondary | ICD-10-CM

## 2013-11-08 DIAGNOSIS — Z515 Encounter for palliative care: Secondary | ICD-10-CM

## 2013-11-08 LAB — COMPREHENSIVE METABOLIC PANEL WITH GFR
AST: 18 U/L (ref 0–37)
Albumin: 2.1 g/dL — ABNORMAL LOW (ref 3.5–5.2)
Alkaline Phosphatase: 90 U/L (ref 39–117)
BUN: 16 mg/dL (ref 6–23)
Potassium: 4.3 meq/L (ref 3.7–5.3)
Sodium: 122 meq/L — ABNORMAL LOW (ref 137–147)
Total Protein: 8.5 g/dL — ABNORMAL HIGH (ref 6.0–8.3)

## 2013-11-08 LAB — CBC WITH DIFFERENTIAL/PLATELET
Basophils Absolute: 0 K/uL (ref 0.0–0.1)
Basophils Relative: 0 % (ref 0–1)
Eosinophils Absolute: 0 10*3/uL (ref 0.0–0.7)
Eosinophils Relative: 1 % (ref 0–5)
HCT: 28.6 % — ABNORMAL LOW (ref 36.0–46.0)
Hemoglobin: 8.9 g/dL — ABNORMAL LOW (ref 12.0–15.0)
Lymphocytes Relative: 22 % (ref 12–46)
Lymphs Abs: 1.4 10*3/uL (ref 0.7–4.0)
MCH: 23.3 pg — ABNORMAL LOW (ref 26.0–34.0)
MCHC: 31.1 g/dL (ref 30.0–36.0)
MCV: 74.9 fL — ABNORMAL LOW (ref 78.0–100.0)
Monocytes Absolute: 0.6 K/uL (ref 0.1–1.0)
Monocytes Relative: 10 % (ref 3–12)
Neutro Abs: 4.1 10*3/uL (ref 1.7–7.7)
Neutrophils Relative %: 67 % (ref 43–77)
Platelets: 430 K/uL — ABNORMAL HIGH (ref 150–400)
RBC: 3.82 MIL/uL — ABNORMAL LOW (ref 3.87–5.11)
RDW: 20.7 % — ABNORMAL HIGH (ref 11.5–15.5)
WBC: 6.2 10*3/uL (ref 4.0–10.5)

## 2013-11-08 LAB — TROPONIN I
Troponin I: <0.3 ng/mL (ref <0.30)
Troponin I: <0.3 ng/mL (ref <0.30)

## 2013-11-08 LAB — CBC
HEMATOCRIT: 29.4 % — AB (ref 36.0–46.0)
Hemoglobin: 9.3 g/dL — ABNORMAL LOW (ref 12.0–15.0)
MCH: 23.7 pg — ABNORMAL LOW (ref 26.0–34.0)
MCHC: 31.6 g/dL (ref 30.0–36.0)
MCV: 74.8 fL — AB (ref 78.0–100.0)
Platelets: 444 10*3/uL — ABNORMAL HIGH (ref 150–400)
RBC: 3.93 MIL/uL (ref 3.87–5.11)
RDW: 20.6 % — ABNORMAL HIGH (ref 11.5–15.5)
WBC: 7.6 10*3/uL (ref 4.0–10.5)

## 2013-11-08 LAB — COMPREHENSIVE METABOLIC PANEL
ALT: 11 U/L (ref 0–35)
CO2: 20 mEq/L (ref 19–32)
Calcium: 8.5 mg/dL (ref 8.4–10.5)
Chloride: 87 mEq/L — ABNORMAL LOW (ref 96–112)
Creatinine, Ser: 0.64 mg/dL (ref 0.50–1.10)
GFR calc Af Amer: 85 mL/min — ABNORMAL LOW (ref >90)
GFR calc non Af Amer: 73 mL/min — ABNORMAL LOW (ref >90)
Glucose, Bld: 94 mg/dL (ref 70–99)
Total Bilirubin: 0.3 mg/dL (ref 0.3–1.2)

## 2013-11-08 LAB — URIC ACID: Uric Acid, Serum: 5.4 mg/dL (ref 2.4–7.0)

## 2013-11-08 LAB — CREATININE, SERUM
Creatinine, Ser: 0.61 mg/dL (ref 0.50–1.10)
GFR calc Af Amer: 87 mL/min — ABNORMAL LOW (ref >90)
GFR calc non Af Amer: 75 mL/min — ABNORMAL LOW (ref >90)

## 2013-11-08 LAB — PRO B NATRIURETIC PEPTIDE: Pro B Natriuretic peptide (BNP): 2437 pg/mL — ABNORMAL HIGH (ref 0–450)

## 2013-11-08 MED ORDER — AMIODARONE HCL 200 MG PO TABS
200.0000 mg | ORAL_TABLET | Freq: Two times a day (BID) | ORAL | Status: DC
  Administered 2013-11-09 – 2013-11-13 (×9): 200 mg via ORAL
  Filled 2013-11-08 (×11): qty 1

## 2013-11-08 MED ORDER — ENOXAPARIN SODIUM 40 MG/0.4ML ~~LOC~~ SOLN
40.0000 mg | SUBCUTANEOUS | Status: DC
  Administered 2013-11-08 – 2013-11-11 (×4): 40 mg via SUBCUTANEOUS
  Filled 2013-11-08 (×5): qty 0.4

## 2013-11-08 MED ORDER — SODIUM CHLORIDE 0.9 % IV SOLN
INTRAVENOUS | Status: DC
  Administered 2013-11-08 – 2013-11-09 (×2): via INTRAVENOUS
  Administered 2013-11-11: 20 mL/h via INTRAVENOUS

## 2013-11-08 MED ORDER — IPRATROPIUM BROMIDE 0.02 % IN SOLN
0.5000 mg | Freq: Once | RESPIRATORY_TRACT | Status: AC
  Administered 2013-11-08: 0.5 mg via RESPIRATORY_TRACT
  Filled 2013-11-08: qty 2.5

## 2013-11-08 MED ORDER — POLYETHYLENE GLYCOL 3350 17 G PO PACK
17.0000 g | PACK | Freq: Every day | ORAL | Status: DC | PRN
  Filled 2013-11-08: qty 1

## 2013-11-08 MED ORDER — SODIUM CHLORIDE 0.9 % IV SOLN: INTRAVENOUS | Status: DC

## 2013-11-08 MED ORDER — BOOST PLUS PO LIQD
237.0000 mL | Freq: Three times a day (TID) | ORAL | Status: DC
  Administered 2013-11-09 – 2013-11-12 (×7): 237 mL via ORAL
  Filled 2013-11-08 (×16): qty 237

## 2013-11-08 MED ORDER — ASPIRIN EC 81 MG PO TBEC
81.0000 mg | DELAYED_RELEASE_TABLET | Freq: Every day | ORAL | Status: DC
  Administered 2013-11-09 – 2013-11-13 (×5): 81 mg via ORAL
  Filled 2013-11-08 (×6): qty 1

## 2013-11-08 MED ORDER — FUROSEMIDE 10 MG/ML IJ SOLN
40.0000 mg | Freq: Two times a day (BID) | INTRAMUSCULAR | Status: DC
  Administered 2013-11-08 – 2013-11-10 (×5): 40 mg via INTRAVENOUS
  Filled 2013-11-08 (×7): qty 4

## 2013-11-08 MED ORDER — SODIUM CHLORIDE 0.9 % IV SOLN
INTRAVENOUS | Status: DC
  Administered 2013-11-08: 15:00:00 via INTRAVENOUS

## 2013-11-08 MED ORDER — ALBUTEROL SULFATE (2.5 MG/3ML) 0.083% IN NEBU
2.5000 mg | INHALATION_SOLUTION | Freq: Once | RESPIRATORY_TRACT | Status: AC
  Administered 2013-11-08: 2.5 mg via RESPIRATORY_TRACT
  Filled 2013-11-08: qty 3

## 2013-11-08 MED ORDER — LEVOTHYROXINE SODIUM 75 MCG PO TABS
75.0000 ug | ORAL_TABLET | Freq: Every day | ORAL | Status: DC
  Administered 2013-11-09 – 2013-11-11 (×3): 75 ug via ORAL
  Filled 2013-11-08 (×4): qty 1

## 2013-11-08 NOTE — Progress Notes (Signed)
Patient's family was concerned that the patient was coughing up white mucus. The PCP on call was notified. At this time we are to monitor the patient.

## 2013-11-08 NOTE — H&P (Signed)
[facility] History and Physical  Kristin Jennings HYQ:[id number] DOB: 1916-04-17 DOA: 11/08/2013  Referring physician:  PCP: Ruben Reason, MD  Specialists:   Chief Complaint: SOB, fatigue  HPI: Kristin Jennings y.o. BF PMHx chronic anemia/pulmonary nodules/elevated total protein with low albumin/elevated immunoglobulins (worrisome for neoplasm) evaluated by Dr. Heath Lark (oncology) on the 08/08/2013 NOTE patient and family refused further workup A- fibrillation, hypertension, multiple lung lesions, liver lesion, monoclonal paraproteinemia, multiple myeloma. Was admitted, to [facility] on 10/26/2013 for cognitive impairment, brought to the emergency department with mental status changes. Patient had told family members last Friday that she was not feeling well, over the weekend becoming progressively weaker, having minimal by mouth intake, less interactive, having an overall functional decline. This morning family members noted her to be increasingly confused and disoriented. family members also reported patient having a cough since November however has worsened over the last several days. Her daughter stated noticing "shaking" however there was no postictal period described by family members. In the emergency room she presented in atrial fibrillation with rapid ventricular response, having heart rates in the 120s and was started on Cardizem IV. Chest x-ray showed findings suggestive of early pneumonia, with CBC showing an elevated white count in the 50,000 range. She was started on empiric IV antibiotic therapy with ceftriaxone and azithromycin. Patient was discharged on 10/30/2013 on amiodarone for rate control of her RVR.  Presented to ED withpersistent decreased energy, inablty to get up and walk which may be due to this persistent SOB. Pt denies abd pain, CP, back pain, or any current pain. She reports her "stomach has been jumping." It seems as though she her labored  breathing is noticeable with abdominal breathing as she is quite thin. I asked her several times and she denies abd pain. She has had a "little bit" of diarrhea, but family and pt cannot be more specific. Pt lives with granddaughter for significant time periods and she is not here. Pt's daughter stays with her some time, but cannot be more specific. No family members currently here can tell us specifically which medications she is on or taking. Pt wishes to remain DNR. Pt's Family states  (+) fatigue/SOB since leaving hospital. Yesterday (+) LOC x 3 min while having BM. Family states that patient for the remainder of the day was positive SOB, negative CP. Family states that patient completed course of antibiotics post discharge on 10/30/2013 as instructed.     Review of Systems: The patient denies anorexia, fever, weight loss,, vision loss, decreased hearing, hoarseness, chest pain, balance deficits, hemoptysis, abdominal pain, melena, hematochezia, severe indigestion/heartburn, hematuria, incontinence, genital sores, muscle weakness, suspicious skin lesions, transient blindness, difficulty walking, depression, unusual weight change, abnormal bleeding, enlarged lymph nodes, angioedema, and breast masses.    TRAVEL HISTORY:   Procedure CXR 11/08/2013 1. New moderate bilateral pleural effusions with associated areas of  atelectasis and/or consolidation throughout the lung bases  bilaterally.    Antibiotics     Consultation    Past Medical History  Diagnosis Date  . Arthritis   . Hypertension   . Atrial fibrillation   . Thyroid disease   . Anemia, unspecified 07/10/2013  . Weight loss 07/11/2013  . Liver lesion 07/11/2013  . Monoclonal paraproteinemia 07/25/2013   History reviewed. No pertinent past surgical history. Social History:  reports that she has never smoked. She does not have any smokeless tobacco history on file. She reports that she does not drink alcohol or  use illicit  drugs. where does patient live--home, ALF, SNF? Home with daughter and has Dietitian  Can patient participate in ADLs? No  Allergies  Allergen Reactions  . Lisinopril Cough    History reviewed. No pertinent family history.   Prior to Admission medications   Medication Sig Start Date End Date Taking? Authorizing Provider  acetaminophen (TYLENOL) 325 MG tablet Take 325 mg by mouth every 6 (six) hours as needed for mild pain.   Yes Historical Provider, MD  amiodarone (PACERONE) 200 MG tablet Take 1 tablet (200 mg total) by mouth 2 (two) times daily. 10/30/13  Yes Donne Hazel, MD  aspirin 81 MG tablet Take 81 mg by mouth 2 (two) times daily.   Yes Historical Provider, MD  hydrALAZINE (APRESOLINE) 10 MG tablet Take 10 mg by mouth 2 (two) times daily.   Yes Historical Provider, MD  lactose free nutrition (BOOST PLUS) LIQD Take 237 mLs by mouth 3 (three) times daily with meals. 10/30/13  Yes Donne Hazel, MD  levothyroxine (SYNTHROID, LEVOTHROID) 75 MCG tablet TAKE 1 TABLET BY MOUTH EVERY DAY 07/04/13  Yes Shawnee Knapp, MD  polyethylene glycol (MIRALAX / GLYCOLAX) packet Take 17 g by mouth daily as needed for moderate constipation.   Yes Historical Provider, MD  azithromycin (ZITHROMAX) 500 MG tablet Take 1 tablet (500 mg total) by mouth daily. 10/30/13   Donne Hazel, MD  cefpodoxime (VANTIN) 200 MG tablet Take 1 tablet (200 mg total) by mouth every 12 (twelve) hours. 10/30/13   Donne Hazel, MD   Physical Exam: Filed Vitals:   11/08/13 1018 11/08/13 1320 11/08/13 1327 11/08/13 1332  BP: 99/55 138/77 130/78 113/100  Pulse: 81 90 119 108  Temp: 98.5 F (36.9 C)     TempSrc: Oral     Resp: 18     SpO2: 94%        General:  A./O. x4, NAD  Eyes: Pupils equal reactive to light and accommodation  Neck: Negative JVD, negative lymphadenopathy  Cardiovascular: Irregular irregular rhythm and rate, negative murmurs rubs or gallops  Respiratory: Decreased sounds bibasilar with  crackles and diffuse expiratory wheezing  Abdomen: Soft, nontender, nondistended, plus bowel sound  Skin: Negative lesions, negative rash  Musculoskeletal: Bilateral pedal edema 2+ to knee  Neurologic: Cranial nerve II through XII intact, tongue/the midline, patient moves all extremities to command, strength 5/5, sensation intact throughout   Labs on Admission:  Basic Metabolic Panel:  Recent Labs Lab 11/08/13 1149  NA 122*  K 4.3  CL 87*  CO2 20  GLUCOSE 94  BUN 16  CREATININE 0.64  CALCIUM 8.5   Liver Function Tests:  Recent Labs Lab 11/08/13 1149  AST 18  ALT 11  ALKPHOS 90  BILITOT 0.3  PROT 8.5*  ALBUMIN 2.1*   No results found for this basename: LIPASE, AMYLASE,  in the last 168 hours No results found for this basename: AMMONIA,  in the last 168 hours CBC:  Recent Labs Lab 11/08/13 1149  WBC 6.2  NEUTROABS 4.1  HGB 8.9*  HCT 28.6*  MCV 74.9*  PLT 430*   Cardiac Enzymes:  Recent Labs Lab 11/08/13 1149  TROPONINI <0.30    BNP (last 3 results)  Recent Labs  10/26/13 2142 11/08/13 1149  PROBNP 2747.0* 2437.0*   CBG: No results found for this basename: GLUCAP,  in the last 168 hours  Radiological Exams on Admission: Dg Chest 2 View  11/08/2013   CLINICAL DATA:  Shortness of breath.  Weakness.  Hypoxia.  EXAM: CHEST  2 VIEW  COMPARISON:  Chest x-ray 10/26/2013.  FINDINGS: Lung volumes are low. There are extensive bibasilar opacities which may reflect areas of atelectasis and/or consolidation. Moderate bilateral pleural effusions. Bilateral apical nodular pleuroparenchymal thickening, most compatible with chronic post infectious or inflammatory scarring. Numerous pulmonary nodules noted on prior CT scan 07/24/2013 are not well demonstrated by plain film evaluation. However, those nodules or stable compared to remote prior studies on the CT examination, indicative of benign pathology.  IMPRESSION: 1. New moderate bilateral pleural effusions with  associated areas of atelectasis and/or consolidation throughout the lung bases bilaterally.   Electronically Signed   By: Vinnie Langton M.D.   On: 11/08/2013 12:31    EKG: Sinus rhythm prolonged QT, interval incomplete RBBB  Assessment/Plan Active Problems:   HTN (hypertension)   Hypothyroidism   Microcytic anemia   Anemia, unspecified   Multiple lung nodules   Liver lesion   Monoclonal paraproteinemia   Atrial fibrillation with RVR   Pleural effusion, bilateral   Pulmonary edema (patient has CXR evidence of CHF) -Daily a.m. weight on a standing scale -IV Lasix 40 mg BID -Salt family secondary to recent pneumonia vs CHF vs neoplasm -ProBNP= 2437 -Echocardiogram pending -Rule out MI, troponin x3  Pleural effusions bilateral -See pulmonary edema  Hyponatremia -Secondary to CHF vs neoplasm vs recent pneumonia vs infection (less likely) -Patient overall fluid overloaded, normal saline at 72m/hr -SIADH? Labs pending  Atrial fibrillation -Rate controlled on amiodarone (new medication could be causing/exacerbating CHF)  HTN -Currently controlled Will was closely  Microcytic anemia -Refused workup from Dr. NHeath Lark(oncology) on the 08/08/2013   Liver lesion -Refused workup from Dr. NHeath Lark(oncology) on the 08/08/2013   Multiple pulmonary lesion -Refused workup from Dr. NHeath Lark(oncology) on the 08/08/2013     Code Status: DO NOT RESUSCITATE Family Communication: Daughter and grandson at bedside for discussion of plan care Disposition Plan: Daughter will contact brother tonight and discuss short-term and long-term goals of care  Time spent: 120 minute  WAllie Bossier[facility] Pager [phone] If 7PM-7AM, please contact night-coverage [email or web address] Password [email or web address] 11/08/2013, 1:51 PM

## 2013-11-08 NOTE — ED Provider Notes (Addendum)
CSN: [id number]     Arrival date & time 11/08/13  1014 History   First MD Initiated Contact with Patient 11/08/13 1035     Chief Complaint  Patient presents with  . Abdominal Pain  . Emesis  . Diarrhea     (Consider location/radiation/quality/duration/timing/severity/associated sxs/prior Treatment) HPI Comments: Pt with no prior h/o smoking, no COPD, asthma, was recnetly admitted for 4 days due to pnuemonia and also had atrial fib.  Family doesn't think pt ever recovered that much, has had persistent decreased energy, inablty to get up and walk which may be due to this persistent SOB.  Pt denies abd pain, CP, back pain, or any current pain.  She reports her "stomach has been jumping."  It seems as though she her labored breathing is noticeable with abdominal breathing as she is quite thin.  I asked her several times and she denies abd pain.  She has had a "little bit" of diarrhea, but family and pt cannot be more specific.  Pt lives with granddaughter for significant time periods and she is not here.  Pt's daughter stays with her some time, but cannot be more specific.  No family members currently here can tell us specifically which medications she is on or taking.  Pt wishes to remain DNR.    Patient is a 80 y.o. female presenting with shortness of breath. The history is provided by the patient, medical records and a relative.  Shortness of Breath Severity:  Moderate Onset quality:  Gradual Duration:  2 weeks Timing:  Constant Progression:  Unchanged Chronicity:  New Relieved by:  Rest Worsened by:  Exertion Ineffective treatments:  Rest Associated symptoms: no abdominal pain, no chest pain, no cough, no fever, no headaches, no sore throat and no vomiting   Risk factors: no recent alcohol use, no hx of cancer and no obesity     Past Medical History  Diagnosis Date  . Arthritis   . Hypertension   . Atrial fibrillation   . Thyroid disease   . Anemia, unspecified 07/10/2013  .  Weight loss 07/11/2013  . Liver lesion 07/11/2013  . Monoclonal paraproteinemia 07/25/2013   History reviewed. No pertinent past surgical history. History reviewed. No pertinent family history. History  Substance Use Topics  . Smoking status: Never Smoker   . Smokeless tobacco: Not on file  . Alcohol Use: No   OB History   Grav Para Term Preterm Abortions TAB SAB Ect Mult Living                 Review of Systems  Constitutional: Positive for appetite change and fatigue. Negative for fever.  HENT: Negative for congestion and sore throat.   Respiratory: Positive for shortness of breath. Negative for cough.   Cardiovascular: Negative for chest pain, palpitations and leg swelling.  Gastrointestinal: Positive for diarrhea. Negative for nausea, vomiting and abdominal pain.  Musculoskeletal: Negative for arthralgias, back pain and myalgias.  Neurological: Positive for weakness. Negative for headaches.  All other systems reviewed and are negative.      Allergies  Lisinopril  Home Medications   Current Outpatient Rx  Name  Route  Sig  Dispense  Refill  . acetaminophen (TYLENOL) 325 MG tablet   Oral   Take 325 mg by mouth every 6 (six) hours as needed for mild pain.         Marland Kitchen amiodarone (PACERONE) 200 MG tablet   Oral   Take 1 tablet (200 mg total) by mouth 2 (  two) times daily.   60 tablet   0   . aspirin 81 MG tablet   Oral   Take 81 mg by mouth 2 (two) times daily.         . hydrALAZINE (APRESOLINE) 10 MG tablet   Oral   Take 10 mg by mouth 2 (two) times daily.         Marland Kitchen lactose free nutrition (BOOST PLUS) LIQD   Oral   Take 237 mLs by mouth 3 (three) times daily with meals.      0     Dispense one month supply, no refills   . levothyroxine (SYNTHROID, LEVOTHROID) 75 MCG tablet      TAKE 1 TABLET BY MOUTH EVERY DAY   30 tablet   5   . polyethylene glycol (MIRALAX / GLYCOLAX) packet   Oral   Take 17 g by mouth daily as needed for moderate  constipation.         Marland Kitchen azithromycin (ZITHROMAX) 500 MG tablet   Oral   Take 1 tablet (500 mg total) by mouth daily.   5 tablet   0   . cefpodoxime (VANTIN) 200 MG tablet   Oral   Take 1 tablet (200 mg total) by mouth every 12 (twelve) hours.   10 tablet   0    BP 99/55  Pulse 81  Temp(Src) 98.5 F (36.9 C) (Oral)  Resp 18  SpO2 94% Physical Exam  Nursing note and vitals reviewed. Constitutional: She appears well-developed. She appears cachectic.  Non-toxic appearance. She does not have a sickly appearance. She does not appear ill. No distress.  HENT:  Head: Normocephalic and atraumatic.  Mouth/Throat: No oropharyngeal exudate.  Eyes: Conjunctivae and EOM are normal. No scleral icterus.  Neck: Normal range of motion. Neck supple. No JVD present.  Cardiovascular: Normal rate, regular rhythm and intact distal pulses.   Pulmonary/Chest: Accessory muscle usage present. Tachypnea noted. She has no decreased breath sounds. She has wheezes. She has no rhonchi. She has no rales.  Abdominal: She exhibits no distension. There is no tenderness. There is no rebound.  Musculoskeletal: She exhibits no edema.  Neurological: She is alert. She exhibits normal muscle tone. Coordination normal.  Skin: Skin is warm. No rash noted. She is not diaphoretic.  Psychiatric: She has a normal mood and affect.    ED Course  Procedures (including critical care time) Labs Review Labs Reviewed  CBC WITH DIFFERENTIAL - Abnormal; Notable for the following:    RBC 3.82 (*)    Hemoglobin 8.9 (*)    HCT 28.6 (*)    MCV 74.9 (*)    MCH 23.3 (*)    RDW 20.7 (*)    Platelets 430 (*)    All other components within normal limits  PRO B NATRIURETIC PEPTIDE - Abnormal; Notable for the following:    Pro B Natriuretic peptide (BNP) 2437.0 (*)    All other components within normal limits  COMPREHENSIVE METABOLIC PANEL - Abnormal; Notable for the following:    Sodium 122 (*)    Chloride 87 (*)    Total  Protein 8.5 (*)    Albumin 2.1 (*)    GFR calc non Af Amer 73 (*)    GFR calc Af Amer 85 (*)    All other components within normal limits  TROPONIN I   Imaging Review Dg Chest 2 View  11/08/2013   CLINICAL DATA:  Shortness of breath.  Weakness.  Hypoxia.  EXAM: CHEST  2 VIEW  COMPARISON:  Chest x-ray 10/26/2013.  FINDINGS: Lung volumes are low. There are extensive bibasilar opacities which may reflect areas of atelectasis and/or consolidation. Moderate bilateral pleural effusions. Bilateral apical nodular pleuroparenchymal thickening, most compatible with chronic post infectious or inflammatory scarring. Numerous pulmonary nodules noted on prior CT scan 07/24/2013 are not well demonstrated by plain film evaluation. However, those nodules or stable compared to remote prior studies on the CT examination, indicative of benign pathology.  IMPRESSION: 1. New moderate bilateral pleural effusions with associated areas of atelectasis and/or consolidation throughout the lung bases bilaterally.   Electronically Signed   By: Vinnie Langton M.D.   On: 11/08/2013 12:31    EKG Interpretation    Date/Time:  Saturday November 08 2013 11:14:58 EST Ventricular Rate:  80 PR Interval:  210 QRS Duration: 97 QT Interval:  470 QTC Calculation: 542 R Axis:   101 Text Interpretation:  Sinus arrhythmia borderline Right axis deviation Low voltage, extremity leads RSR' in V1 or V2, probably normal variant Nonspecific T abnrm, anterolateral leads Prolonged QT interval Abnormal ECG Confirmed by [facility]  MD, MICHEAL ([id number]) on 11/08/2013 11:22:15 AM           RA sat is 94% and I interpret to be adequate    1:02 PM Pt with only marginal improvement in symptoms with neb.  CXR shows new effusions, could be related more with CHF, although no sig change in BNP.  Na also low at 122.  Due to age and symptomatic, would consider re-admission.  Pt was on cefpodoxime and azithromycin, likely completed course on 2/10.  WBC is  only 6, no fever, so doubt that there is underlying pneumonia, esp since completed abx course.  Pt seems to have been placed on amiodarone as well which is known to rarely cause pulmonary toxicity.  Will discuss with hospitalist.     1:49 PM Dr. Sherral Hammers agrees to admit to tele floor.   MDM   Final diagnoses:  Pleural effusion, bilateral  Dyspnea  CHF (congestive heart failure)  Hyponatremia    Pt with exp and some ins wheezing.  No h/o asthma or COPD per family, but recent pneumonia.  Will give breathing treatment, check BNP, ECG, CXR.  Not toxic appearing, no fever.      Saddie Benders. Dorna Mai, MD 11/08/13 [place] Dorna Mai, MD 11/08/13 [place] Andrya Roppolo, MD 11/08/13 1349

## 2013-11-08 NOTE — ED Notes (Signed)
Her family tell me pt. Has had some "vomits and has diarrhea a few times a day and a cough" x ~2 weeks.  She is mild-mod. Short of breath and in no distress. I do hear faint exp. Wheezes all lung fields bilat. Per auscultation.  Despite all of our numerous strenuous efforts, we are unable to obtain an acceptable pleth for pulse oxymetry.  We place her on o2 at 2 l.p.m. At this time.  Pt. Denies any pain.

## 2013-11-08 NOTE — ED Notes (Signed)
Family at bedside. 

## 2013-11-08 NOTE — ED Notes (Signed)
Patient grandson states he believes the patient is having difficulty swallowing her food. Patient experiences intermittent vomiting while eating recently. Patient c/o "jumping" sensation in her stomach.

## 2013-11-08 NOTE — ED Notes (Signed)
Patient transported to X-ray 

## 2013-11-08 NOTE — ED Notes (Signed)
Pt from home c/o abd pain, emesis, diarrhea since she was d/c from this facility approx 2 weeks. Ago. Pt family adds that pt has bilateral leg swelling, cough with SOB. Pt denies CP. Pt is A&O and in NAD

## 2013-11-09 ENCOUNTER — Encounter (HOSPITAL_COMMUNITY): Payer: Self-pay | Admitting: Internal Medicine

## 2013-11-09 DIAGNOSIS — I2789 Other specified pulmonary heart diseases: Secondary | ICD-10-CM

## 2013-11-09 DIAGNOSIS — I509 Heart failure, unspecified: Secondary | ICD-10-CM

## 2013-11-09 DIAGNOSIS — C9000 Multiple myeloma not having achieved remission: Secondary | ICD-10-CM

## 2013-11-09 DIAGNOSIS — I3139 Other pericardial effusion (noninflammatory): Secondary | ICD-10-CM | POA: Diagnosis present

## 2013-11-09 DIAGNOSIS — D472 Monoclonal gammopathy: Secondary | ICD-10-CM

## 2013-11-09 DIAGNOSIS — I313 Pericardial effusion (noninflammatory): Secondary | ICD-10-CM | POA: Diagnosis present

## 2013-11-09 DIAGNOSIS — R0989 Other specified symptoms and signs involving the circulatory and respiratory systems: Secondary | ICD-10-CM

## 2013-11-09 DIAGNOSIS — E871 Hypo-osmolality and hyponatremia: Secondary | ICD-10-CM

## 2013-11-09 DIAGNOSIS — I272 Pulmonary hypertension, unspecified: Secondary | ICD-10-CM | POA: Diagnosis present

## 2013-11-09 DIAGNOSIS — I10 Essential (primary) hypertension: Secondary | ICD-10-CM

## 2013-11-09 DIAGNOSIS — I319 Disease of pericardium, unspecified: Secondary | ICD-10-CM

## 2013-11-09 DIAGNOSIS — I4891 Unspecified atrial fibrillation: Secondary | ICD-10-CM

## 2013-11-09 DIAGNOSIS — R0609 Other forms of dyspnea: Secondary | ICD-10-CM

## 2013-11-09 DIAGNOSIS — I5033 Acute on chronic diastolic (congestive) heart failure: Principal | ICD-10-CM

## 2013-11-09 LAB — COMPREHENSIVE METABOLIC PANEL WITH GFR
ALT: 10 U/L (ref 0–35)
AST: 17 U/L (ref 0–37)
Albumin: 2.1 g/dL — ABNORMAL LOW (ref 3.5–5.2)
Alkaline Phosphatase: 89 U/L (ref 39–117)
BUN: 15 mg/dL (ref 6–23)
CO2: 19 meq/L (ref 19–32)
Calcium: 8.4 mg/dL (ref 8.4–10.5)
Chloride: 88 meq/L — ABNORMAL LOW (ref 96–112)
Creatinine, Ser: 0.71 mg/dL (ref 0.50–1.10)
GFR calc Af Amer: 82 mL/min — ABNORMAL LOW
GFR calc non Af Amer: 71 mL/min — ABNORMAL LOW
Glucose, Bld: 87 mg/dL (ref 70–99)
Potassium: 4.6 meq/L (ref 3.7–5.3)
Sodium: 123 meq/L — ABNORMAL LOW (ref 137–147)
Total Bilirubin: 0.3 mg/dL (ref 0.3–1.2)
Total Protein: 8.3 g/dL (ref 6.0–8.3)

## 2013-11-09 LAB — CBC WITH DIFFERENTIAL/PLATELET
Basophils Absolute: 0 K/uL (ref 0.0–0.1)
Basophils Relative: 0 % (ref 0–1)
Eosinophils Absolute: 0 K/uL (ref 0.0–0.7)
Eosinophils Relative: 0 % (ref 0–5)
HCT: 30 % — ABNORMAL LOW (ref 36.0–46.0)
Hemoglobin: 9.5 g/dL — ABNORMAL LOW (ref 12.0–15.0)
Lymphocytes Relative: 23 % (ref 12–46)
Lymphs Abs: 1.3 K/uL (ref 0.7–4.0)
MCH: 23.9 pg — ABNORMAL LOW (ref 26.0–34.0)
MCHC: 31.7 g/dL (ref 30.0–36.0)
MCV: 75.4 fL — ABNORMAL LOW (ref 78.0–100.0)
Monocytes Absolute: 0.6 K/uL (ref 0.1–1.0)
Monocytes Relative: 12 % (ref 3–12)
Neutro Abs: 3.6 K/uL (ref 1.7–7.7)
Neutrophils Relative %: 65 % (ref 43–77)
Platelets: 359 K/uL (ref 150–400)
RBC: 3.98 MIL/uL (ref 3.87–5.11)
RDW: 20.8 % — ABNORMAL HIGH (ref 11.5–15.5)
WBC: 5.5 K/uL (ref 4.0–10.5)

## 2013-11-09 LAB — TSH: TSH: 7.817 u[IU]/mL — ABNORMAL HIGH (ref 0.350–4.500)

## 2013-11-09 LAB — NA AND K (SODIUM & POTASSIUM), RAND UR
Potassium Urine: 41 mEq/L
SODIUM UR: 82 meq/L

## 2013-11-09 LAB — OSMOLALITY, URINE: OSMOLALITY UR: 489 mosm/kg (ref 390–1090)

## 2013-11-09 LAB — OSMOLALITY: OSMOLALITY: 262 mosm/kg — AB (ref 275–300)

## 2013-11-09 LAB — MAGNESIUM: Magnesium: 1.4 mg/dL — ABNORMAL LOW (ref 1.5–2.5)

## 2013-11-09 LAB — CHLORIDE, URINE, RANDOM: Chloride Urine: 108 meq/L

## 2013-11-09 LAB — TROPONIN I: Troponin I: <0.3 ng/mL (ref <0.30)

## 2013-11-09 MED ORDER — MAGNESIUM SULFATE 40 MG/ML IJ SOLN
2.0000 g | Freq: Once | INTRAMUSCULAR | Status: AC
  Administered 2013-11-09: 2 g via INTRAVENOUS
  Filled 2013-11-09: qty 50

## 2013-11-09 NOTE — Progress Notes (Signed)
  Echocardiogram 2D Echocardiogram has been performed.  Donata Clay 11/09/2013, 9:46 AM

## 2013-11-09 NOTE — Consult Note (Signed)
CARDIOLOGY ADMIT NOTE     Primary Care Physician: Ruben Reason, MD Referring Physician:  Hospitalist  Admit Date: 11/08/2013  Reason for consultation:  Afib, CHF  Kristin Jennings is a 80 y.o. female with a h/o hypertension, atrial fibrillation, and recently diagnosed monoclonal paraproteinemia who presents with progressive decline over the past 2 weeks.  She was initially admitted with sepsis.  She was treated and released after several days.  She did well initially at home but has subsequently developed progressive SOB and fatigue.  She reports that she became very weak and collapsed while at home yesterday.  She returned to [facility] where she was found to have hyponatremia, new pleural effusions, and afib with RVR.  She had an echo today which revealed preserved EF, moderate TR, moderate to severe pulmonary hypertension, and a moderate sized pericardial effusion without hemodynamic compromise.  Cardiology is consulted for further assessment. She reports improvement in her symptoms with diuresis.  Her energy is improving. Today, she denies symptoms of palpitations, chest pain, dizziness, presyncope, syncope, or neurologic sequela. The patient is tolerating medications without difficulties and is otherwise without complaint today.   Past Medical History  Diagnosis Date  . Arthritis   . Hypertension   . Atrial fibrillation   . Thyroid disease   . Anemia, unspecified 07/10/2013  . Weight loss 07/11/2013  . Liver lesion 07/11/2013  . Monoclonal paraproteinemia 07/25/2013   History reviewed. No pertinent past surgical history.  Marland Kitchen amiodarone  200 mg Oral BID  . aspirin EC  81 mg Oral Daily  . enoxaparin (LOVENOX) injection  40 mg Subcutaneous Q24H  . furosemide  40 mg Intravenous BID  . lactose free nutrition  237 mL Oral TID WC  . levothyroxine  75 mcg Oral QAC breakfast   . sodium chloride 20 mL/hr at 11/09/13 1500    Allergies  Allergen Reactions  . Lisinopril Cough    History     Social History  . Marital Status: Widowed    Spouse Name: N/A    Number of Children: N/A  . Years of Education: N/A   Occupational History  . Not on file.   Social History Main Topics  . Smoking status: Never Smoker   . Smokeless tobacco: Not on file  . Alcohol Use: No  . Drug Use: No  . Sexual Activity: Not Currently   Other Topics Concern  . Not on file   Social History Narrative  . No narrative on file    Family History  Problem Relation Age of Onset  . Hypertension      ROS- pt has difficulty with recall and cannot participate in full ROS  Physical Exam: Telemetry:  afib with controlled V rate Filed Vitals:   11/08/13 1511 11/08/13 2201 11/09/13 0608 11/09/13 1455  BP: 133/80 122/72 125/65 124/76  Pulse: 105 81 87 88  Temp: 97.8 F (36.6 C) 97.8 F (36.6 C) 97.9 F (36.6 C) 97.8 F (36.6 C)  TempSrc: Oral Oral Oral Oral  Resp: '20 19 18 20  ' Height: '5\' 7"'  (1.702 m)     Weight: 124 lb (56.246 kg)  123 lb 10.9 oz (56.1 kg)   SpO2: 98% 96% 97% 98%    GEN- The patient is frail and elderly appearing, alert but confused Head- normocephalic, atraumatic Eyes-  Sclera clear, conjunctiva pink Ears- hearing intact Oropharynx- clear Neck- supple, JVP 9cm Lungs- decreased BS at the bases Heart- irregular rate and rhythm  GI- soft, NT, ND, + BS Extremities-  no clubbing, cyanosis, +1 edema MS- diffuse muscle atrophy Skin- no rash or lesion Psych- euthymic mood, full affect Neuro- strength and sensation are intact  EKG:  Sinus rhythm 80 bpm, nonspecific ST/T changes  Labs:   Lab Results  Component Value Date   WBC 5.5 11/09/2013   HGB 9.5* 11/09/2013   HCT 30.0* 11/09/2013   MCV 75.4* 11/09/2013   PLT 359 11/09/2013    Recent Labs Lab 11/09/13 0354  NA 123*  K 4.6  CL 88*  CO2 19  BUN 15  CREATININE 0.71  CALCIUM 8.4  PROT 8.3  BILITOT 0.3  ALKPHOS 89  ALT 10  AST 17  GLUCOSE 87   Echo and CXR are reviewed Medical records are reviewed in  epic  ASSESSMENT AND PLAN:   1. afib Likely exacerbated by chronic pulmonary hypertension, advanced age, and acute medical illness. Continue amiodarone for rate control at this time. chads2vasc score is at least 4 however presently, I am reluctant to consider anticoagulation in the setting of her effusions and acute medical illness.  This can be reconsidered if she improves. As she is presently rate controlled, no changes are advised at this time.  TFTs are pending  2. Pericardial/ pleural effusions Possibly related to CHF, though I would also be concerned about malignancy or inflammatory process.  I will check ESR.  If she improves with diuresis no further workup is advised.  3. Acute diastolic chf Likely due to afib and recent hospitalization for sepsis with fluid administered Continue current diuresis and follow.  Follow sodium with diuresis.  4. HTN  Stable No change required today  Her prognosis is poor.  I had a long discussion with the patient, her daughter, and her grandson.  She is very clear that her wishes are DNI/DNR.  This is certainly appropriate.  If she has further decline then palliative measures would seem reasonable.  Dr Johnsie Cancel will follow-up tomorrow.     Thompson Grayer, MD 11/09/2013  6:48 PM

## 2013-11-09 NOTE — Progress Notes (Signed)
[facility] PROGRESS NOTE  Kristin Jennings EHM:[id number] DOB: 1916-03-11 DOA: 11/08/2013 PCP: Ruben Reason, MD  Assessment/Plan:  Pulmonary edema (patient has CXR evidence of CHF)  -Daily a.m. weight on a standing scale  -Admission weight = 56.2 kg , 2/15 bed weight= 56.1 kg -IV Lasix 40 mg BID  -Salt family secondary to recent pneumonia vs CHF vs neoplasm  -ProBNP= 2437  -Echocardiogram pending  -Troponin x3 negative  Pleural effusions bilateral  -See pulmonary edema   Hyponatremia  -Secondary to CHF vs neoplasm vs recent pneumonia vs infection (less likely)  -Patient overall fluid overloaded, normal saline at [facility] -[facility]? Labs pending   Atrial fibrillation  -Rate controlled on amiodarone (new medication could be causing/exacerbating CHF)  Acute on chronic diastolic CHF -Patient last seen by [facility] cardiology 12/12/2012 for new onset atrial fibrillation -Considering the valvular damage/pulmonary hypertension/dilated ventricle and atrium most likely had CHF at that time. -Patient currently in decompensated CHF with diuresis  -Strict I and O. -Daily a.m. Weight  Pulmonary hypertension -Would normally start a beta blocker, ACE inhibitor and nitrate however patient's BP will not tolerate these agents. -Continue amiodarone which was started on patient's visit approximately a week ago for her A. fib  Pericardial effusion -Although currently not hemodynamically compromising patient will consult cardiology  HTN  -Currently controlled watch closely  Microcytic anemia  -Refused workup from Dr. Heath Lark (oncology) on the 08/08/2013   Liver lesion  -Refused workup from Dr. Heath Lark (oncology) on the 08/08/2013   Multiple pulmonary lesion  -Refused workup from Dr. Heath Lark (oncology) on the 08/08/2013      Code Status: DO NOT RESUSCITATE  Family Communication: Daughter and grandson at bedside for discussion of plan care  Disposition Plan: Daughter will  contact brother tonight and discuss short-term and long-term goals of care     Procedure  Echocardiogram 11/09/2013 Left ventricle: The cavity size was normal. Systolic function was normal. Wall motion was normal; there were no regional wall motion abnormalities. - Right atrium: The atrium was mildly to moderately dilated. - Tricuspid valve: Moderate regurgitation. - Pulmonary arteries: Systolic pressure was moderately increased. PA peak pressure: 17m Hg (S). - Pericardium, extracardiac: A moderate pericardial effusion was identified circumferential to the heart. There was no evidence of hemodynamic compromise.     CXR 11/08/2013  1. New moderate bilateral pleural effusions with associated areas of  atelectasis and/or consolidation throughout the lung bases  bilaterally.    Antibiotics     Consultation   HPI/Subjective:  Kristin KERTESZ89y.o. BF PMHx chronic anemia/pulmonary nodules/elevated total protein with low albumin/elevated immunoglobulins (worrisome for neoplasm) evaluated by Dr. NHeath Lark(oncology) on the 08/08/2013 NOTE patient and family refused further workup  A- fibrillation, hypertension, multiple lung lesions, liver lesion, monoclonal paraproteinemia, multiple myeloma. [facility] for cognitive impairment, brought to the emergency department with mental status changes. Patient had told family members last Friday that she was not feeling well, over the weekend becoming progressively weaker, having minimal by mouth intake, less interactive, having an overall functional decline. This morning family members noted her to be increasingly confused and disoriented. family members also reported patient having a cough since November however has worsened over the last several days. Her daughter stated noticing "shaking" however there was no postictal period described by family members. In the emergency room she presented in atrial  fibrillation with rapid ventricular response, having heart rates in the 120s and was started on  Cardizem IV. Chest x-ray showed findings suggestive of early pneumonia, with CBC showing an elevated white count in the 50,000 range. She was started on empiric IV antibiotic therapy with ceftriaxone and azithromycin. Patient was discharged on 10/30/2013 on amiodarone for rate control of her RVR.  Presented to ED withpersistent decreased energy, inablty to get up and walk which may be due to this persistent SOB. Pt denies abd pain, CP, back pain, or any current pain. She reports her "stomach has been jumping." It seems as though she her labored breathing is noticeable with abdominal breathing as she is quite thin. I asked her several times and she denies abd pain. She has had a "little bit" of diarrhea, but family and pt cannot be more specific. Pt lives with granddaughter for significant time periods and she is not here. Pt's daughter stays with her some time, but cannot be more specific. No family members currently here can tell us specifically which medications she is on or taking. Pt wishes to remain DNR. Pt's Family states (+) fatigue/SOB since leaving hospital. Yesterday (+) LOC x 3 min while having BM. Family states that patient for the remainder of the day was positive SOB, negative CP. Family states that patient completed course of antibiotics post discharge on 10/30/2013 as instructed.  Objective: Filed Vitals:   11/08/13 1440 11/08/13 1511 11/08/13 2201 11/09/13 0608  BP: 130/85 133/80 122/72 125/65  Pulse: 84 105 81 87  Temp:  97.8 F (36.6 C) 97.8 F (36.6 C) 97.9 F (36.6 C)  TempSrc:  Oral Oral Oral  Resp: '18 20 19 18  ' Height:  '5\' 7"'  (1.702 m)    Weight:  56.246 kg (124 lb)  56.1 kg (123 lb 10.9 oz)  SpO2:  98% 96% 97%    Intake/Output Summary (Last 24 hours) at 11/09/13 0819 Last data filed at 11/09/13 0340  Gross per 24 hour  Intake 708.34 ml  Output    500 ml  Net 208.34 ml   Filed  Weights   11/08/13 1511 11/09/13 0608  Weight: 56.246 kg (124 lb) 56.1 kg (123 lb 10.9 oz)    Exam: General: A./O. x4, NAD Neck: Negative JVD, negative lymphadenopathy  Cardiovascular: Irregular irregular rhythm and rate, negative murmurs rubs or gallops Respiratory: Decreased sounds bibasilar with crackles and diffuse expiratory wheezing Abdomen: Soft, nontender, nondistended, plus bowel sound  Skin: Negative lesions, negative rash  Musculoskeletal: Right pedal edema 2+ to knee, left pedal edema decreased to 1+     Data Reviewed: Basic Metabolic Panel:  Recent Labs Lab 11/08/13 1149 11/08/13 1634 11/09/13 0354  NA 122*  --  123*  K 4.3  --  4.6  CL 87*  --  88*  CO2 20  --  19  GLUCOSE 94  --  87  BUN 16  --  15  CREATININE 0.64 0.61 0.71  CALCIUM 8.5  --  8.4  MG  --   --  1.4*   Liver Function Tests:  Recent Labs Lab 11/08/13 1149 11/09/13 0354  AST 18 17  ALT 11 10  ALKPHOS 90 89  BILITOT 0.3 0.3  PROT 8.5* 8.3  ALBUMIN 2.1* 2.1*   No results found for this basename: LIPASE, AMYLASE,  in the last 168 hours No results found for this basename: AMMONIA,  in the last 168 hours CBC:  Recent Labs Lab 11/08/13 1149 11/08/13 1634 11/09/13 0354  WBC 6.2 7.6 5.5  NEUTROABS 4.1  --  3.6  HGB 8.9* 9.3*  9.5*  HCT 28.6* 29.4* 30.0*  MCV 74.9* 74.8* 75.4*  PLT 430* 444* 359   Cardiac Enzymes:  Recent Labs Lab 11/08/13 1149 11/08/13 1634 11/08/13 2127 11/09/13 0350  TROPONINI <0.30 <0.30 <0.30 <0.30   BNP (last 3 results)  Recent Labs  10/26/13 2142 11/08/13 1149  PROBNP 2747.0* 2437.0*   CBG: No results found for this basename: GLUCAP,  in the last 168 hours  No results found for this or any previous visit (from the past 240 hour(s)).   Studies: Dg Chest 2 View  11/08/2013   CLINICAL DATA:  Shortness of breath.  Weakness.  Hypoxia.  EXAM: CHEST  2 VIEW  COMPARISON:  Chest x-ray 10/26/2013.  FINDINGS: Lung volumes are low. There are extensive  bibasilar opacities which may reflect areas of atelectasis and/or consolidation. Moderate bilateral pleural effusions. Bilateral apical nodular pleuroparenchymal thickening, most compatible with chronic post infectious or inflammatory scarring. Numerous pulmonary nodules noted on prior CT scan 07/24/2013 are not well demonstrated by plain film evaluation. However, those nodules or stable compared to remote prior studies on the CT examination, indicative of benign pathology.  IMPRESSION: 1. New moderate bilateral pleural effusions with associated areas of atelectasis and/or consolidation throughout the lung bases bilaterally.   Electronically Signed   By: Vinnie Langton M.D.   On: 11/08/2013 12:31    Scheduled Meds: . amiodarone  200 mg Oral BID  . aspirin EC  81 mg Oral Daily  . enoxaparin (LOVENOX) injection  40 mg Subcutaneous Q24H  . furosemide  40 mg Intravenous BID  . lactose free nutrition  237 mL Oral TID WC  . levothyroxine  75 mcg Oral QAC breakfast  . magnesium sulfate 1 - 4 g bolus IVPB  2 g Intravenous Once   Continuous Infusions: . sodium chloride 50 mL/hr at 11/09/13 6381    Active Problems:   HTN (hypertension)   Hypothyroidism   Microcytic anemia   Anemia, unspecified   Multiple lung nodules   Liver lesion   Monoclonal paraproteinemia   Atrial fibrillation with RVR   Pleural effusion, bilateral   Bilateral pleural effusion   Multiple myeloma without remission    Time spent: 45 minutes    WOODS, CURTIS, J  [facility] Pager [phone]. If 7PM-7AM, please contact night-coverage at [email or web address], password [facility] 11/09/2013, 8:19 AM  LOS: 1 day

## 2013-11-09 NOTE — Evaluation (Signed)
Clinical/Bedside Swallow Evaluation Patient Details  Name: Kristin Jennings MRN: [id number] Date of Birth: Jun 09, 1916  Today's Date: 11/09/2013 Time: 1515-1600 SLP Time Calculation (min): 45 min  Past Medical History:  Past Medical History  Diagnosis Date  . Arthritis   . Hypertension   . Atrial fibrillation   . Thyroid disease   . Anemia, unspecified 07/10/2013  . Weight loss 07/11/2013  . Liver lesion 07/11/2013  . Monoclonal paraproteinemia 07/25/2013   Past Surgical History: History reviewed. No pertinent past surgical history. HPI:  Kristin Jennings  80 y.o. BF PMHx chronic anemia/pulmonary nodules/elevated total protein with low albumin/elevated immunoglobulins (worrisome for neoplasm) evaluated by Dr. Heath Lark (oncology) on the 08/08/2013.    A- fibrillation, hypertension, multiple lung lesions, liver lesion, monoclonal paraproteinemia, multiple myeloma. Was admitted, to [facility] on 10/26/2013 for cognitive impairment, brought to the emergency department with mental status changes. Patient had told family members last Friday that she was not feeling well, over the weekend becoming progressively weaker, having minimal by mouth intake, less interactive, having an overall functional decline. This morning family members noted her to be increasingly confused and disoriented. family members also reported patient having a cough since November however has worsened over the last several days. Her daughter stated noticing "shaking" however there was no postictal period described by family members. In the emergency room she presented in atrial fibrillation with rapid ventricular response, having heart rates in the 120s and was started on Cardizem IV. Chest x-ray showed findings suggestive of early pneumonia, with CBC showing an elevated white count in the 50,000 range. She was started on empiric IV antibiotic therapy with ceftriaxone and azithromycin. Patient was discharged on  10/30/2013 on amiodarone for rate control of her RVR.  Presented to ED withpersistent decreased energy, inablty to get up and walk which may be due to this persistent SOB. Pt denies abd pain, CP, back pain, or any current pain. She reports her "stomach has been jumping." It seems as though she her labored breathing is noticeable with abdominal breathing as she is quite thin. I asked her several times and she denies abd pain. She has had a "little bit" of diarrhea, but family and pt cannot be more specific. Pt lives with granddaughter for significant time periods and she is not here. Pt's daughter stays with her some time, but cannot be more specific. No family members currently here can tell us specifically which medications she is on or taking. Pt wishes to remain DNR. Pt's Family states (+) fatigue/SOB since leaving hospital. Yesterday (+) LOC x 3 min while having BM. Family states that patient for the remainder of the day was positive SOB, negative CP. Family states that patient completed course of antibiotics post discharge on 10/30/2013 as instructed.   BSE ordered due to recent reports of difficulty swallowing.  Family members report recently patient having to extend head back for "food to go down".     Assessment / Plan / Recommendation Clinical Impression  BSE completed.  Dentures not available.  Family member present during evaluation states she will bring in dentures next date.  No coughing or outward clinical s/s of aspiration noted initially with PO trials.  Required several rest breaks due to decreased reserve.   Unproductive wet cough noted moderately after the swallow of soft solids with expiratory wheeze and  increased WOB indicating possible penetration from residuals vs. reflux .   Due to patient presenting with weakness and decreased endurance recommend to proceed  with modified diet of  dysphagia 1(puree) and thin liquids by cup sips only.  Recommend total assist with all POs.  Due to s/s  present  and current respiratory status recommend to proceed with objective evaluation of MBS to assess risk for aspiration and determine safest, PO diet.  MBS to be completed 11/10/13.  ST to follow in acute setting for dysphagia management.      Aspiration Risk  Moderate    Diet Recommendation Thin liquid;Dysphagia 1 (Puree)   Liquid Administration via: Cup;No straw Medication Administration: Crushed with puree Supervision: Staff to assist with self feeding;Full supervision/cueing for compensatory strategies Compensations: Slow rate;Small sips/bites Postural Changes and/or Swallow Maneuvers: Seated upright 90 degrees;Upright 30-60 min after meal    Other  Recommendations Recommended Consults: MBS Oral Care Recommendations: Oral care Q4 per protocol Other Recommendations: Clarify dietary restrictions   Follow Up Recommendations  Home health SLP    Frequency and Duration     2x weekly for 2 weeks        SLP Swallow Goals Please refer to Care Plans   Swallow Study Prior Functional Status   Lives at home with 24 hour care by family members    General Date of Onset: 11/08/13 HPI: Kristin Jennings  80 y.o. BF PMHx chronic anemia/pulmonary nodules/elevated total protein with low albumin/elevated immunoglobulins (worrisome for neoplasm) evaluated by Dr. Heath Lark (oncology) on the 08/08/2013 NOTE patient and family refused further workup Type of Study: Bedside swallow evaluation Diet Prior to this Study: NPO Temperature Spikes Noted: No Respiratory Status: Room air History of Recent Intubation: No Behavior/Cognition: Alert;Cooperative;Pleasant mood Oral Cavity - Dentition: Edentulous Self-Feeding Abilities: Needs assist;Able to feed self with adaptive devices Patient Positioning: Upright in bed Baseline Vocal Quality: Clear Volitional Cough: Wet Volitional Swallow: Able to elicit    Oral/Motor/Sensory Function Overall Oral Motor/Sensory Function: Appears within functional  limits for tasks assessed   Ice Chips Ice chips: Not tested   Thin Liquid Thin Liquid: Impaired Presentation: Cup Pharyngeal  Phase Impairments: Suspected delayed Swallow;Decreased hyoid-laryngeal movement;Cough - Delayed    Nectar Thick Nectar Thick Liquid: Not tested   Honey Thick Honey Thick Liquid: Not tested   Puree Puree: Impaired Oral Phase Functional Implications: Prolonged oral transit Pharyngeal Phase Impairments: Suspected delayed Swallow;Decreased hyoid-laryngeal movement   Solid   GO    Solid: Impaired Oral Phase Impairments: Poor awareness of bolus;Impaired mastication Pharyngeal Phase Impairments: Suspected delayed Swallow;Decreased hyoid-laryngeal movement;Cough - Delayed      Sharman Crate Sterling, [place] [facility] 11/09/2013,4:06 PM

## 2013-11-09 NOTE — Progress Notes (Signed)
Pt converted to Afib around 0730. No complaints of CP, rate controlled. MD made aware. No new orders. Callie Fielding RN

## 2013-11-10 ENCOUNTER — Inpatient Hospital Stay (HOSPITAL_COMMUNITY): Payer: Medicare Other

## 2013-11-10 LAB — SEDIMENTATION RATE: SED RATE: 46 mm/h — AB (ref 0–22)

## 2013-11-10 LAB — CBC WITH DIFFERENTIAL/PLATELET
BASOS ABS: 0 10*3/uL (ref 0.0–0.1)
BASOS PCT: 0 % (ref 0–1)
Eosinophils Absolute: 0 10*3/uL (ref 0.0–0.7)
Eosinophils Relative: 1 % (ref 0–5)
HCT: 27.1 % — ABNORMAL LOW (ref 36.0–46.0)
Hemoglobin: 8.5 g/dL — ABNORMAL LOW (ref 12.0–15.0)
Lymphocytes Relative: 27 % (ref 12–46)
Lymphs Abs: 1.6 10*3/uL (ref 0.7–4.0)
MCH: 23.5 pg — ABNORMAL LOW (ref 26.0–34.0)
MCHC: 31.4 g/dL (ref 30.0–36.0)
MCV: 75.1 fL — ABNORMAL LOW (ref 78.0–100.0)
Monocytes Absolute: 0.8 10*3/uL (ref 0.1–1.0)
Monocytes Relative: 14 % — ABNORMAL HIGH (ref 3–12)
NEUTROS ABS: 3.4 10*3/uL (ref 1.7–7.7)
Neutrophils Relative %: 58 % (ref 43–77)
PLATELETS: 445 10*3/uL — AB (ref 150–400)
RBC: 3.61 MIL/uL — ABNORMAL LOW (ref 3.87–5.11)
RDW: 21.2 % — AB (ref 11.5–15.5)
WBC: 5.9 10*3/uL (ref 4.0–10.5)

## 2013-11-10 LAB — COMPREHENSIVE METABOLIC PANEL
ALBUMIN: 2 g/dL — AB (ref 3.5–5.2)
ALT: 10 U/L (ref 0–35)
AST: 17 U/L (ref 0–37)
Alkaline Phosphatase: 104 U/L (ref 39–117)
BILIRUBIN TOTAL: 0.2 mg/dL — AB (ref 0.3–1.2)
BUN: 19 mg/dL (ref 6–23)
CO2: 22 meq/L (ref 19–32)
CREATININE: 0.95 mg/dL (ref 0.50–1.10)
Calcium: 8.1 mg/dL — ABNORMAL LOW (ref 8.4–10.5)
Chloride: 89 mEq/L — ABNORMAL LOW (ref 96–112)
GFR, EST AFRICAN AMERICAN: 57 mL/min — AB (ref >90)
GFR, EST NON AFRICAN AMERICAN: 49 mL/min — AB (ref >90)
Glucose, Bld: 125 mg/dL — ABNORMAL HIGH (ref 70–99)
Potassium: 4.4 mEq/L (ref 3.7–5.3)
Sodium: 125 mEq/L — ABNORMAL LOW (ref 137–147)
Total Protein: 7.8 g/dL (ref 6.0–8.3)

## 2013-11-10 LAB — URINE CULTURE
CULTURE: NO GROWTH
Colony Count: NO GROWTH

## 2013-11-10 LAB — MAGNESIUM: Magnesium: 1.7 mg/dL (ref 1.5–2.5)

## 2013-11-10 LAB — TSH: TSH: 8.536 u[IU]/mL — AB (ref 0.350–4.500)

## 2013-11-10 MED ORDER — FUROSEMIDE 40 MG PO TABS
40.0000 mg | ORAL_TABLET | Freq: Two times a day (BID) | ORAL | Status: DC
  Administered 2013-11-11 – 2013-11-13 (×5): 40 mg via ORAL
  Filled 2013-11-10 (×7): qty 1

## 2013-11-10 NOTE — Progress Notes (Signed)
[facility] PROGRESS NOTE  Kristin Jennings UUV:[id number] DOB: [birth year] DOA: 11/08/2013 PCP: Ruben Reason, MD  Assessment/Plan:  Pulmonary edema (patient has CXR evidence of CHF)  -Daily a.m. weight on a standing scale  -Admission weight = 56.2 kg , 2/16 bed weight= 53.5 kg - Changed to PO Lasix 40 mg BID  - Effusion secondary to recent pneumonia vs CHF vs neoplasm  -ProBNP= 2437  -Echocardiogram see results below  -Troponin x3 negative  Pleural effusions bilateral  -See pulmonary edema   Hyponatremia  -Secondary to CHF vs neoplasm vs recent pneumonia vs infection (less likely)  -Patient overall fluid overloaded, normal saline at [facility] do not support diagnosis of SIADH   - 2/16 sodium trending up  Atrial fibrillation  -Rate controlled on amiodarone (new medication could be causing/exacerbating CHF) - Per cardiology Will continue amiodarone  Acute on chronic diastolic CHF -Patient last seen by [facility] cardiology 12/12/2012 for new onset atrial fibrillation -Considering the valvular damage/pulmonary hypertension/dilated ventricle and atrium most likely had CHF at that time. -Patient currently in decompensated CHF with diuresis  -Strict I and O. -Daily a.m. Weight  Pulmonary hypertension -Would normally start a beta blocker, ACE inhibitor and nitrate however patient's BP will not tolerate these agents. -Continue amiodarone which was started on patient's visit approximately a week ago for her A. fib  Pericardial effusion -Currently not hemodynamically compromising. Cardiology concurred  HTN  -Currently controlled watch closely  Microcytic anemia  -Refused workup from Dr. Heath Lark (oncology) on the 08/08/2013   Liver lesion  -Refused workup from Dr. Heath Lark (oncology) on the 08/08/2013   Multiple pulmonary lesion  -Refused workup from Dr. Heath Lark (oncology) on the 08/08/2013  - Family agrees to meeting with palliative care to discuss  short-term and long-term goal for her care. Consult placed     Code Status: DO NOT RESUSCITATE  Family Communication: Daughter and grandson at bedside for discussion of plan care  Disposition Plan: Daughter will contact brother tonight and discuss short-term and long-term goals of care     Procedure  Echocardiogram 11/09/2013 Left ventricle: The cavity size was normal. Systolic function was normal. Wall motion was normal; there were no regional wall motion abnormalities. - Right atrium: The atrium was mildly to moderately dilated. - Tricuspid valve: Moderate regurgitation. - Pulmonary arteries: Systolic pressure was moderately increased. PA peak pressure: 49m Hg (S). - Pericardium, extracardiac: A moderate pericardial effusion was identified circumferential to the heart. There was no evidence of hemodynamic compromise.     CXR 11/08/2013  1. New moderate bilateral pleural effusions with associated areas of  atelectasis and/or consolidation throughout the lung bases  bilaterally.    Antibiotics     Consultation   HPI/Subjective:  Kristin WATANABE80y.o. BF PMHx chronic anemia/pulmonary nodules/elevated total protein with low albumin/elevated immunoglobulins (worrisome for neoplasm) evaluated by Dr. NHeath Lark(oncology) on the 08/08/2013 NOTE patient and family refused further workup  A- fibrillation, hypertension, multiple lung lesions, liver lesion, monoclonal paraproteinemia, multiple myeloma. [facility], brought to the emergency department with mental status changes. Patient had told family members last Friday that she was not feeling well, over the weekend becoming progressively weaker, having minimal by mouth intake, less interactive, having an overall functional decline. This morning family members noted her to be increasingly confused and disoriented. family members also reported patient having a cough  since November however has worsened over the last several days.  Her daughter stated noticing "shaking" however there was no postictal period described by family members. In the emergency room she presented in atrial fibrillation with rapid ventricular response, having heart rates in the 120s and was started on Cardizem IV. Chest x-ray showed findings suggestive of early pneumonia, with CBC showing an elevated white count in the 50,000 range. She was started on empiric IV antibiotic therapy with ceftriaxone and azithromycin. Patient was discharged on 10/30/2013 on amiodarone for rate control of her RVR.  Presented to ED withpersistent decreased energy, inablty to get up and walk which may be due to this persistent SOB. Pt denies abd pain, CP, back pain, or any current pain. She reports her "stomach has been jumping." It seems as though she her labored breathing is noticeable with abdominal breathing as she is quite thin. I asked her several times and she denies abd pain. She has had a "little bit" of diarrhea, but family and pt cannot be more specific. Pt lives with granddaughter for significant time periods and she is not here. Pt's daughter stays with her some time, but cannot be more specific. No family members currently here can tell us specifically which medications she is on or taking. Pt wishes to remain DNR. Pt's Family states (+) fatigue/SOB since leaving hospital. Yesterday (+) LOC x 3 min while having BM. Family states that patient for the remainder of the day was positive SOB, negative CP. Family states that patient completed course of antibiotics post discharge on 10/30/2013 as instructed. 2/16 no acute events overnight patient remains fatigued, family agrees to meet with palliative care  Objective: Filed Vitals:   11/09/13 0608 11/09/13 1455 11/09/13 2140 11/10/13 0613  BP: 125/65 124/76 110/64 118/68  Pulse: 87 88 102 87  Temp: 97.9 F (36.6 C) 97.8 F (36.6 C) 97.7 F (36.5 C) 97.3 F (36.3 C)   TempSrc: Oral Oral Oral Oral  Resp: '18 20 20 18  ' Height:      Weight: 56.1 kg (123 lb 10.9 oz)   56.609 kg (124 lb 12.8 oz)  SpO2: 97% 98% 100% 97%    Intake/Output Summary (Last 24 hours) at 11/10/13 1511 Last data filed at 11/10/13 0300  Gross per 24 hour  Intake    490 ml  Output      0 ml  Net    490 ml   Filed Weights   11/08/13 1511 11/09/13 0608 11/10/13 0613  Weight: 56.246 kg (124 lb) 56.1 kg (123 lb 10.9 oz) 56.609 kg (124 lb 12.8 oz)    Exam: General: A./O. x4, NAD Neck: Negative JVD, negative lymphadenopathy  Cardiovascular: Irregular irregular rhythm and rate, negative murmurs rubs or gallops Respiratory: Decreased sounds bibasilar with crackles and diffuse expiratory wheezing Abdomen: Soft, nontender, nondistended, plus bowel sound  Skin: Negative lesions, negative rash  Musculoskeletal: Right pedal edema 2+ to knee, left pedal edema decreased to 1+     Data Reviewed: Basic Metabolic Panel:  Recent Labs Lab 11/08/13 1149 11/08/13 1634 11/09/13 0354 11/10/13 0524  NA 122*  --  123* 125*  K 4.3  --  4.6 4.4  CL 87*  --  88* 89*  CO2 20  --  19 22  GLUCOSE 94  --  87 125*  BUN 16  --  15 19  CREATININE 0.64 0.61 0.71 0.95  CALCIUM 8.5  --  8.4 8.1*  MG  --   --  1.4* 1.7   Liver Function Tests:  Recent Labs Lab 11/08/13 1149  11/09/13 0354 11/10/13 0524  AST '18 17 17  ' ALT '11 10 10  ' ALKPHOS 90 89 104  BILITOT 0.3 0.3 0.2*  PROT 8.5* 8.3 7.8  ALBUMIN 2.1* 2.1* 2.0*   No results found for this basename: LIPASE, AMYLASE,  in the last 168 hours No results found for this basename: AMMONIA,  in the last 168 hours CBC:  Recent Labs Lab 11/08/13 1149 11/08/13 1634 11/09/13 0354 11/10/13 0524  WBC 6.2 7.6 5.5 5.9  NEUTROABS 4.1  --  3.6 3.4  HGB 8.9* 9.3* 9.5* 8.5*  HCT 28.6* 29.4* 30.0* 27.1*  MCV 74.9* 74.8* 75.4* 75.1*  PLT 430* 444* 359 445*   Cardiac Enzymes:  Recent Labs Lab 11/08/13 1149 11/08/13 1634 11/08/13 2127  11/09/13 0350  TROPONINI <0.30 <0.30 <0.30 <0.30   BNP (last 3 results)  Recent Labs  10/26/13 2142 11/08/13 1149  PROBNP 2747.0* 2437.0*   CBG: No results found for this basename: GLUCAP,  in the last 168 hours  Recent Results (from the past 240 hour(s))  URINE CULTURE     Status: None   Collection Time    11/09/13  1:26 AM      Result Value Ref Range Status   Specimen Description URINE, RANDOM   Final   Special Requests NONE   Final   Culture  Setup Time     Final   Value: 11/09/2013 16:36     Performed at [place]     Final   Value: NO GROWTH     Performed at [facility]   Culture     Final   Value: NO GROWTH     Performed at [facility]   Report Status 11/10/2013 FINAL   Final     Studies: Dg Swallowing Func-speech Pathology  11/10/2013   [place], CCC-SLP     11/10/2013 12:44 PM Objective Swallowing Evaluation: Bedside swallow evaluation  Patient Details  Name: Kristin Jennings MRN: [id number] Date of Birth: 06/01/1916  Today's Date: 11/10/2013 Time: 1220-1240 SLP Time Calculation (min): 20 min  Past Medical History:  Past Medical History  Diagnosis Date  . Arthritis   . Hypertension   . Atrial fibrillation   . Thyroid disease   . Anemia, unspecified 07/10/2013  . Weight loss 07/11/2013  . Liver lesion 07/11/2013  . Monoclonal paraproteinemia 07/25/2013   Past Surgical History: History reviewed. No pertinent past  surgical history. HPI:  Kristin Jennings 80 y.o. BF PMHx chronic anemia/pulmonary  nodules/elevated total protein with low albumin/elevated  immunoglobulins (worrisome for neoplasm) evaluated by Dr. Heath Lark (oncology) on the 08/08/2013, A- fibrillation,  hypertension, multiple lung lesions, liver lesion, monoclonal  paraproteinemia, multiple myeloma. Was admitted, to [facility]  hospital on 10/26/2013 for cognitive impairment, brought to the  emergency department with mental status changes. Patient had told   family members last Friday that she was not feeling well, over  the weekend becoming progressively weaker, having minimal by  mouth intake, less interactive, having an overall functional  decline. This morning family members noted her to be increasingly  confused and disoriented. family members also reported patient  having a cough since November however has worsened over the last  several days. Her daughter stated noticing "shaking" however  there was no postictal period described by family members. In the  emergency room she presented in atrial fibrillation with rapid  ventricular response, having heart rates in the 120s and was  started on Cardizem IV. Chest x-ray showed findings suggestive of  early pneumonia, with CBC showing an elevated white count in the  50,000 range. She was started on empiric IV antibiotic therapy  with ceftriaxone and azithromycin. Patient was discharged on  10/30/2013 on amiodarone for rate control of her RVR. Presented to  ED withpersistent decreased energy, inablty to get up and walk  which may be due to this persistent SOB. Pt denies abd pain, CP,  back pain, or any current pain. She reports her "stomach has been  jumping." It seems as though she her labored breathing is  noticeable with abdominal breathing as she is quite thin. I asked  her several times and she denies abd pain. She has had a "little  bit" of diarrhea, but family and pt cannot be more specific. Pt  lives with granddaughter for significant time periods and she is  not here. Pt's daughter stays with her some time, but cannot be  more specific. No family members currently here can tell us  specifically which medications she is on or taking. Pt wishes to  remain DNR. Pt's Family states (+) fatigue/SOB since leaving  hospital. Yesterday (+) LOC x 3 min while having BM. Family  states that patient for the remainder of the day was positive  SOB, negative CP. Family states that patient completed course of  antibiotics post  discharge on 10/30/2013 as instructed. BSE ordered  due to recent reports of difficulty swallowing.     Assessment / Plan / Recommendation Clinical Impression  Dysphagia Diagnosis: Mild oral phase dysphagia;Mild pharyngeal  phase dysphagia Clinical impression: Patient presents with a mild oropharyngeal  dysphagia characterized by oral delays with peicemealing of all  boluses and severe delays with soft solids due to missing  dentition, followed by a delay in swallow initiation resulting in  inconsistent flash penetration of liquid consistencies. No  aspiration noted. Mild pharyngeal residuals noted post swallow  clear with spontaneous dry swallows when provided with extra  time. At this time, overall function relatively age appropriate.  Risk of aspiration mild and likely will be exacerbated by any  acute illness or deconditioning. Recommend continuation of  current diet with advacement of solids once patient has dentures.  No SLP f/u indicated at this time.     Treatment Recommendation  No treatment recommended at this time    Diet Recommendation Thin liquid;Dysphagia 1 (Puree)   Liquid Administration via: Cup;Straw Medication Administration: Whole meds with liquid Supervision: Patient able to self feed;Full supervision/cueing  for compensatory strategies Compensations: Slow rate;Small sips/bites Postural Changes and/or Swallow Maneuvers: Seated upright 90  degrees;Upright 30-60 min after meal    Other  Recommendations Oral Care Recommendations: Oral care BID   Follow Up Recommendations  None               General Date of Onset: 11/08/13 HPI: Kristin Jennings 80 y.o. BF PMHx chronic anemia/pulmonary  nodules/elevated total protein with low albumin/elevated  immunoglobulins (worrisome for neoplasm) evaluated by Dr. Heath Lark (oncology) on the 08/08/2013, A- fibrillation,  hypertension, multiple lung lesions, liver lesion, monoclonal  paraproteinemia, multiple myeloma. Was admitted, to [facility]  hospital on  10/26/2013 for cognitive impairment, brought to the  emergency department with mental status changes. Patient had told  family members last Friday that she was not feeling well, over  the weekend becoming progressively weaker, having minimal by  mouth intake, less interactive, having an overall functional  decline. This morning family members  noted her to be increasingly  confused and disoriented. family members also reported patient  having a cough since November however has worsened over the last  several days. Her daughter stated noticing "shaking" however  there was no postictal period described by family members. In the  emergency room she presented in atrial fibrillation with rapid  ventricular response, having heart rates in the 120s and was  started on Cardizem IV. Chest x-ray showed findings suggestive of  early pneumonia, with CBC showing an elevated white count in the  50,000 range. She was started on empiric IV antibiotic therapy  with ceftriaxone and azithromycin. Patient was discharged on  10/30/2013 on amiodarone for rate control of her RVR. Presented to  ED withpersistent decreased energy, inablty to get up and walk  which may be due to this persistent SOB. Pt denies abd pain, CP,  back pain, or any current pain. She reports her "stomach has been  jumping." It seems as though she her labored breathing is  noticeable with abdominal breathing as she is quite thin. I asked  her several times and she denies abd pain. She has had a "little  bit" of diarrhea, but family and pt cannot be more specific. Pt  lives with granddaughter for significant time periods and she is  not here. Pt's daughter stays with her some time, but cannot be  more specific. No family members currently here can tell us  specifically which medications she is on or taking. Pt wishes to  remain DNR. Pt's Family states (+) fatigue/SOB since leaving  hospital. Yesterday (+) LOC x 3 min while having BM. Family  states that patient for the  remainder of the day was positive  SOB, negative CP. Family states that patient completed course of  antibiotics post discharge on 10/30/2013 as instructed. BSE ordered  due to recent reports of difficulty swallowing. Type of Study: Bedside swallow evaluation Reason for Referral: Objectively evaluate swallowing function Previous Swallow Assessment: BSE complete 2/14 indicated need for  MBS to objectively evaluate function Diet Prior to this Study: Dysphagia 1 (puree);Thin liquids Temperature Spikes Noted: No Respiratory Status: Room air History of Recent Intubation: No Behavior/Cognition: Alert;Cooperative;Pleasant mood Oral Cavity - Dentition: Dentures, not available (per patient,  dentures at home) Oral Motor / Sensory Function: Within functional limits Self-Feeding Abilities: Able to feed self Patient Positioning: Upright in chair Baseline Vocal Quality: Clear Volitional Swallow: Able to elicit Anatomy: Within functional limits Pharyngeal Secretions: Not observed secondary MBS    Reason for Referral Objectively evaluate swallowing function   Oral Phase Oral Preparation/Oral Phase Oral Phase: Impaired Oral - Nectar Oral - Nectar Cup: Lingual/palatal residue;Piecemeal swallowing Oral - Thin Oral - Thin Cup: Lingual/palatal residue;Piecemeal swallowing Oral - Thin Straw: Lingual/palatal residue;Piecemeal swallowing Oral - Solids Oral - Puree: Lingual/palatal residue;Piecemeal swallowing Oral - Mechanical Soft: Lingual/palatal residue;Piecemeal  swallowing;Delayed oral transit Oral - Pill: Delayed oral transit   Pharyngeal Phase Pharyngeal Phase Pharyngeal Phase: Impaired Pharyngeal - Nectar Pharyngeal - Nectar Cup: Delayed swallow initiation;Premature  spillage to valleculae;Pharyngeal residue - valleculae;Pharyngeal  residue - pyriform sinuses;Reduced airway/laryngeal  closure;Penetration/Aspiration before swallow Penetration/Aspiration details (nectar cup): Material enters  airway, remains ABOVE vocal cords then  ejected out Pharyngeal - Thin Pharyngeal - Thin Cup: Delayed swallow initiation;Premature  spillage to valleculae;Pharyngeal residue - valleculae;Pharyngeal  residue - pyriform sinuses;Penetration/Aspiration before  swallow;Reduced airway/laryngeal closure Penetration/Aspiration details (thin cup): Material enters  airway, remains ABOVE vocal cords then ejected out (flash  penetration) Pharyngeal - Solids Pharyngeal -  Puree: Delayed swallow initiation;Premature spillage  to valleculae;Pharyngeal residue - valleculae Pharyngeal - Mechanical Soft: Delayed swallow  initiation;Premature spillage to valleculae;Pharyngeal residue -  valleculae Pharyngeal - Pill: Delayed swallow initiation;Premature spillage  to valleculae  Cervical Esophageal Phase    GO    Cervical Esophageal Phase Cervical Esophageal Phase: WFL (? mild delayed transit through  lower, MD not present )        Gabriel Rainwater MA, CCC-SLP [phone]  McCoy Leah Meryl 11/10/2013, 12:44 PM     Scheduled Meds: . amiodarone  200 mg Oral BID  . aspirin EC  81 mg Oral Daily  . enoxaparin (LOVENOX) injection  40 mg Subcutaneous Q24H  . furosemide  40 mg Intravenous BID  . lactose free nutrition  237 mL Oral TID WC  . levothyroxine  75 mcg Oral QAC breakfast   Continuous Infusions: . sodium chloride 20 mL/hr at 11/09/13 1500    Active Problems:   HTN (hypertension)   Hypothyroidism   Microcytic anemia   Anemia, unspecified   Multiple lung nodules   Liver lesion   Monoclonal paraproteinemia   Atrial fibrillation with RVR   Pleural effusion, bilateral   Bilateral pleural effusion   Multiple myeloma without remission   Diastolic CHF, acute on chronic   Pulmonary hypertension   Pericardial effusion    Time spent: 45 minutes    WOODS, CURTIS, J  [facility] Pager [phone]. If 7PM-7AM, please contact night-coverage at [email or web address], password [facility] 11/10/2013, 3:11 PM  LOS: 2 days

## 2013-11-10 NOTE — Procedures (Addendum)
Objective Swallowing Evaluation: Bedside swallow evaluation  Patient Details  Name: Kristin Jennings MRN: [id number] Date of Birth: 1915-12-24  Today's Date: 11/10/2013 Time: 1220-1240 SLP Time Calculation (min): 20 min  Past Medical History:  Past Medical History  Diagnosis Date  . Arthritis   . Hypertension   . Atrial fibrillation   . Thyroid disease   . Anemia, unspecified 07/10/2013  . Weight loss 07/11/2013  . Liver lesion 07/11/2013  . Monoclonal paraproteinemia 07/25/2013   Past Surgical History: History reviewed. No pertinent past surgical history. HPI:  Kristin Jennings 80 y.o. BF PMHx chronic anemia/pulmonary nodules/elevated total protein with low albumin/elevated immunoglobulins (worrisome for neoplasm) evaluated by Dr. Heath Lark (oncology) on the 08/08/2013, A- fibrillation, hypertension, multiple lung lesions, liver lesion, monoclonal paraproteinemia, multiple myeloma. Was admitted, to [facility] long hospital on 10/26/2013 for cognitive impairment, brought to the emergency department with mental status changes. Patient had told family members last Friday that she was not feeling well, over the weekend becoming progressively weaker, having minimal by mouth intake, less interactive, having an overall functional decline. This morning family members noted her to be increasingly confused and disoriented. family members also reported patient having a cough since November however has worsened over the last several days. Her daughter stated noticing "shaking" however there was no postictal period described by family members. In the emergency room she presented in atrial fibrillation with rapid ventricular response, having heart rates in the 120s and was started on Cardizem IV. Chest x-ray showed findings suggestive of early pneumonia, with CBC showing an elevated white count in the 50,000 range. She was started on empiric IV antibiotic therapy with ceftriaxone and azithromycin. Patient  was discharged on 10/30/2013 on amiodarone for rate control of her RVR. Presented to ED withpersistent decreased energy, inablty to get up and walk which may be due to this persistent SOB. Pt denies abd pain, CP, back pain, or any current pain. She reports her "stomach has been jumping." It seems as though she her labored breathing is noticeable with abdominal breathing as she is quite thin. I asked her several times and she denies abd pain. She has had a "little bit" of diarrhea, but family and pt cannot be more specific. Pt lives with granddaughter for significant time periods and she is not here. Pt's daughter stays with her some time, but cannot be more specific. No family members currently here can tell us specifically which medications she is on or taking. Pt wishes to remain DNR. Pt's Family states (+) fatigue/SOB since leaving hospital. Yesterday (+) LOC x 3 min while having BM. Family states that patient for the remainder of the day was positive SOB, negative CP. Family states that patient completed course of antibiotics post discharge on 10/30/2013 as instructed. BSE ordered due to recent reports of difficulty swallowing.     Assessment / Plan / Recommendation Clinical Impression  Dysphagia Diagnosis: Mild oral phase dysphagia;Mild pharyngeal phase dysphagia Clinical impression: Patient presents with a mild oropharyngeal dysphagia characterized by oral delays with peicemealing of all boluses and severe delays with soft solids due to missing dentition, followed by a delay in swallow initiation resulting in inconsistent flash penetration of liquid consistencies. No aspiration noted. Mild pharyngeal residuals noted post swallow clear with spontaneous dry swallows when provided with extra time. At this time, overall function relatively age appropriate. Risk of aspiration mild and likely will be exacerbated by any acute illness or deconditioning. Recommend continuation of current diet with advacement of  solids  once patient has dentures. No SLP f/u indicated at this time.Education complete via phone with patient's grandaughter regarding test results, recommendations, and aspiration precautions.      Treatment Recommendation  No treatment recommended at this time    Diet Recommendation Thin liquid;Dysphagia 1 (Puree)   Liquid Administration via: Cup;Straw Medication Administration: Whole meds with liquid Supervision: Patient able to self feed;Full supervision/cueing for compensatory strategies Compensations: Slow rate;Small sips/bites Postural Changes and/or Swallow Maneuvers: Seated upright 90 degrees;Upright 30-60 min after meal    Other  Recommendations Oral Care Recommendations: Oral care BID   Follow Up Recommendations  None               General Date of Onset: 11/08/13 HPI: SONNY POTH 80 y.o. BF PMHx chronic anemia/pulmonary nodules/elevated total protein with low albumin/elevated immunoglobulins (worrisome for neoplasm) evaluated by Dr. Heath Lark (oncology) on the 08/08/2013, A- fibrillation, hypertension, multiple lung lesions, liver lesion, monoclonal paraproteinemia, multiple myeloma. Was admitted, to [facility] hospital on 10/26/2013 for cognitive impairment, brought to the emergency department with mental status changes. Patient had told family members last Friday that she was not feeling well, over the weekend becoming progressively weaker, having minimal by mouth intake, less interactive, having an overall functional decline. This morning family members noted her to be increasingly confused and disoriented. family members also reported patient having a cough since November however has worsened over the last several days. Her daughter stated noticing "shaking" however there was no postictal period described by family members. In the emergency room she presented in atrial fibrillation with rapid ventricular response, having heart rates in the 120s and was started on  Cardizem IV. Chest x-ray showed findings suggestive of early pneumonia, with CBC showing an elevated white count in the 50,000 range. She was started on empiric IV antibiotic therapy with ceftriaxone and azithromycin. Patient was discharged on 10/30/2013 on amiodarone for rate control of her RVR. Presented to ED withpersistent decreased energy, inablty to get up and walk which may be due to this persistent SOB. Pt denies abd pain, CP, back pain, or any current pain. She reports her "stomach has been jumping." It seems as though she her labored breathing is noticeable with abdominal breathing as she is quite thin. I asked her several times and she denies abd pain. She has had a "little bit" of diarrhea, but family and pt cannot be more specific. Pt lives with granddaughter for significant time periods and she is not here. Pt's daughter stays with her some time, but cannot be more specific. No family members currently here can tell us specifically which medications she is on or taking. Pt wishes to remain DNR. Pt's Family states (+) fatigue/SOB since leaving hospital. Yesterday (+) LOC x 3 min while having BM. Family states that patient for the remainder of the day was positive SOB, negative CP. Family states that patient completed course of antibiotics post discharge on 10/30/2013 as instructed. BSE ordered due to recent reports of difficulty swallowing. Type of Study: Bedside swallow evaluation Reason for Referral: Objectively evaluate swallowing function Previous Swallow Assessment: BSE complete 2/14 indicated need for MBS to objectively evaluate function Diet Prior to this Study: Dysphagia 1 (puree);Thin liquids Temperature Spikes Noted: No Respiratory Status: Room air History of Recent Intubation: No Behavior/Cognition: Alert;Cooperative;Pleasant mood Oral Cavity - Dentition: Dentures, not available (per patient, dentures at home) Oral Motor / Sensory Function: Within functional limits Self-Feeding  Abilities: Able to feed self Patient Positioning: Upright in chair Baseline Vocal  Quality: Clear Volitional Swallow: Able to elicit Anatomy: Within functional limits Pharyngeal Secretions: Not observed secondary MBS    Reason for Referral Objectively evaluate swallowing function   Oral Phase Oral Preparation/Oral Phase Oral Phase: Impaired Oral - Nectar Oral - Nectar Cup: Lingual/palatal residue;Piecemeal swallowing Oral - Thin Oral - Thin Cup: Lingual/palatal residue;Piecemeal swallowing Oral - Thin Straw: Lingual/palatal residue;Piecemeal swallowing Oral - Solids Oral - Puree: Lingual/palatal residue;Piecemeal swallowing Oral - Mechanical Soft: Lingual/palatal residue;Piecemeal swallowing;Delayed oral transit Oral - Pill: Delayed oral transit   Pharyngeal Phase Pharyngeal Phase Pharyngeal Phase: Impaired Pharyngeal - Nectar Pharyngeal - Nectar Cup: Delayed swallow initiation;Premature spillage to valleculae;Pharyngeal residue - valleculae;Pharyngeal residue - pyriform sinuses;Reduced airway/laryngeal closure;Penetration/Aspiration before swallow Penetration/Aspiration details (nectar cup): Material enters airway, remains ABOVE vocal cords then ejected out Pharyngeal - Thin Pharyngeal - Thin Cup: Delayed swallow initiation;Premature spillage to valleculae;Pharyngeal residue - valleculae;Pharyngeal residue - pyriform sinuses;Penetration/Aspiration before swallow;Reduced airway/laryngeal closure Penetration/Aspiration details (thin cup): Material enters airway, remains ABOVE vocal cords then ejected out (flash penetration) Pharyngeal - Solids Pharyngeal - Puree: Delayed swallow initiation;Premature spillage to valleculae;Pharyngeal residue - valleculae Pharyngeal - Mechanical Soft: Delayed swallow initiation;Premature spillage to valleculae;Pharyngeal residue - valleculae Pharyngeal - Pill: Delayed swallow initiation;Premature spillage to valleculae  Cervical Esophageal  Phase    GO    Cervical Esophageal Phase Cervical Esophageal Phase: WFL (? mild delayed transit through lower, MD not present )        Gabriel Rainwater MA, CCC-SLP [phone]  Errica Dutil Meryl 11/10/2013, 12:44 PM

## 2013-11-10 NOTE — Progress Notes (Signed)
Patient ID: Kristin Jennings, female   DOB: 10-Oct-1915, 80 y.o.   MRN: [id number]    Subjective:  Lethargic resting comfortably   Objective:  Filed Vitals:   11/09/13 0608 11/09/13 1455 11/09/13 2140 11/10/13 0613  BP: 125/65 124/76 110/64 118/68  Pulse: 87 88 102 87  Temp: 97.9 F (36.6 C) 97.8 F (36.6 C) 97.7 F (36.5 C) 97.3 F (36.3 C)  TempSrc: Oral Oral Oral Oral  Resp: 18 20 20 18   Height:      Weight: 123 lb 10.9 oz (56.1 kg)   124 lb 12.8 oz (56.609 kg)  SpO2: 97% 98% 100% 97%    Intake/Output from previous day:  Intake/Output Summary (Last 24 hours) at 11/10/13 0911 Last data filed at 11/10/13 0300  Gross per 24 hour  Intake 933.33 ml  Output      0 ml  Net 933.33 ml    Physical Exam: Affect appropriate Frail elderly black female  HEENT: normal Neck supple with no adenopathy JVP normal no bruits no thyromegaly Lungs clear anterior  with no wheezing and good diaphragmatic motion Heart:  S1/S2 soft SEM  murmur, no rub, gallop or click PMI normal Abdomen: benighn, BS positve, no tenderness, no AAA no bruit.  No HSM or HJR Distal pulses intact with no bruits No edema Neuro non-focal Skin warm and dry No muscular weakness   Lab Results: Basic Metabolic Panel:  Recent Labs  11/09/13 0354 11/10/13 0524  NA 123* 125*  K 4.6 4.4  CL 88* 89*  CO2 19 22  GLUCOSE 87 125*  BUN 15 19  CREATININE 0.71 0.95  CALCIUM 8.4 8.1*  MG 1.4* 1.7   Liver Function Tests:  Recent Labs  11/09/13 0354 11/10/13 0524  AST 17 17  ALT 10 10  ALKPHOS 89 104  BILITOT 0.3 0.2*  PROT 8.3 7.8  ALBUMIN 2.1* 2.0*   CBC:  Recent Labs  11/09/13 0354 11/10/13 0524  WBC 5.5 5.9  NEUTROABS 3.6 3.4  HGB 9.5* 8.5*  HCT 30.0* 27.1*  MCV 75.4* 75.1*  PLT 359 445*   Cardiac Enzymes:  Recent Labs  11/08/13 1634 11/08/13 2127 11/09/13 0350  TROPONINI <0.30 <0.30 <0.30   Thyroid Function Tests:  Recent Labs  11/09/13 0354  TSH 7.817*     Imaging: Dg Chest 2 View  11/08/2013   CLINICAL DATA:  Shortness of breath.  Weakness.  Hypoxia.  EXAM: CHEST  2 VIEW  COMPARISON:  Chest x-ray 10/26/2013.  FINDINGS: Lung volumes are low. There are extensive bibasilar opacities which may reflect areas of atelectasis and/or consolidation. Moderate bilateral pleural effusions. Bilateral apical nodular pleuroparenchymal thickening, most compatible with chronic post infectious or inflammatory scarring. Numerous pulmonary nodules noted on prior CT scan 07/24/2013 are not well demonstrated by plain film evaluation. However, those nodules or stable compared to remote prior studies on the CT examination, indicative of benign pathology.  IMPRESSION: 1. New moderate bilateral pleural effusions with associated areas of atelectasis and/or consolidation throughout the lung bases bilaterally.   Electronically Signed   By: Vinnie Langton M.D.   On: 11/08/2013 12:31    Cardiac Studies:  ECG:  afib nonspecific ST/T wave changes RSR'   Telemetry:  afib rates ok no long pauses   Echo: Study Conclusions  - Left ventricle: The cavity size was normal. Systolic function was normal. Wall motion was normal; there were no regional wall motion abnormalities. - Right atrium: The atrium was mildly to moderately dilated. - Tricuspid  valve: Moderate regurgitation. - Pulmonary arteries: Systolic pressure was moderately increased. PA peak pressure: 75mm Hg (S). - Pericardium, extracardiac: A moderate pericardial effusion was identified circumferential to the heart. There was no evidence of hemodynamic compromise.   Medications:   . amiodarone  200 mg Oral BID  . aspirin EC  81 mg Oral Daily  . enoxaparin (LOVENOX) injection  40 mg Subcutaneous Q24H  . furosemide  40 mg Intravenous BID  . lactose free nutrition  237 mL Oral TID WC  . levothyroxine  75 mcg Oral QAC breakfast     . sodium chloride 20 mL/hr at 11/09/13 1500    Assessment/Plan:  Afib:  Rate  ok  Continue low dose amiodarone  No anticoagulation given age and comorbidities Pericardial Effusion:  Not hemodynamically significant but another good reason not to anticoagulate  Change to PO diuretic  Consider increasing synthroid to 100ug with TSH in 7 range and effusion  DNR  Will sign off   Jenkins Rouge 11/10/2013, 9:11 AM

## 2013-11-11 DIAGNOSIS — R5381 Other malaise: Secondary | ICD-10-CM

## 2013-11-11 DIAGNOSIS — Z515 Encounter for palliative care: Secondary | ICD-10-CM

## 2013-11-11 DIAGNOSIS — R1310 Dysphagia, unspecified: Secondary | ICD-10-CM

## 2013-11-11 DIAGNOSIS — R5383 Other fatigue: Secondary | ICD-10-CM

## 2013-11-11 DIAGNOSIS — E039 Hypothyroidism, unspecified: Secondary | ICD-10-CM

## 2013-11-11 LAB — CBC WITH DIFFERENTIAL/PLATELET
Basophils Absolute: 0 10*3/uL (ref 0.0–0.1)
Basophils Relative: 0 % (ref 0–1)
EOS ABS: 0 10*3/uL (ref 0.0–0.7)
EOS PCT: 1 % (ref 0–5)
HEMATOCRIT: 28.6 % — AB (ref 36.0–46.0)
Hemoglobin: 8.9 g/dL — ABNORMAL LOW (ref 12.0–15.0)
Lymphocytes Relative: 34 % (ref 12–46)
Lymphs Abs: 1.6 10*3/uL (ref 0.7–4.0)
MCH: 23.5 pg — ABNORMAL LOW (ref 26.0–34.0)
MCHC: 31.1 g/dL (ref 30.0–36.0)
MCV: 75.5 fL — ABNORMAL LOW (ref 78.0–100.0)
Monocytes Absolute: 0.7 10*3/uL (ref 0.1–1.0)
Monocytes Relative: 14 % — ABNORMAL HIGH (ref 3–12)
NEUTROS ABS: 2.5 10*3/uL (ref 1.7–7.7)
Neutrophils Relative %: 51 % (ref 43–77)
Platelets: 424 10*3/uL — ABNORMAL HIGH (ref 150–400)
RBC: 3.79 MIL/uL — AB (ref 3.87–5.11)
RDW: 21.5 % — ABNORMAL HIGH (ref 11.5–15.5)
WBC: 4.8 10*3/uL (ref 4.0–10.5)

## 2013-11-11 LAB — COMPREHENSIVE METABOLIC PANEL
ALBUMIN: 2 g/dL — AB (ref 3.5–5.2)
ALK PHOS: 94 U/L (ref 39–117)
ALT: 10 U/L (ref 0–35)
AST: 17 U/L (ref 0–37)
BUN: 16 mg/dL (ref 6–23)
CALCIUM: 7.9 mg/dL — AB (ref 8.4–10.5)
CO2: 25 mEq/L (ref 19–32)
Chloride: 89 mEq/L — ABNORMAL LOW (ref 96–112)
Creatinine, Ser: 0.93 mg/dL (ref 0.50–1.10)
GFR calc Af Amer: 59 mL/min — ABNORMAL LOW (ref >90)
GFR calc non Af Amer: 51 mL/min — ABNORMAL LOW (ref >90)
Glucose, Bld: 87 mg/dL (ref 70–99)
POTASSIUM: 4.1 meq/L (ref 3.7–5.3)
SODIUM: 126 meq/L — AB (ref 137–147)
TOTAL PROTEIN: 7.6 g/dL (ref 6.0–8.3)
Total Bilirubin: 0.3 mg/dL (ref 0.3–1.2)

## 2013-11-11 LAB — MAGNESIUM: Magnesium: 1.5 mg/dL (ref 1.5–2.5)

## 2013-11-11 LAB — TSH: TSH: 8.439 u[IU]/mL — ABNORMAL HIGH (ref 0.350–4.500)

## 2013-11-11 MED ORDER — LEVOTHYROXINE SODIUM 100 MCG PO TABS
100.0000 ug | ORAL_TABLET | Freq: Every day | ORAL | Status: DC
  Administered 2013-11-12 – 2013-11-13 (×2): 100 ug via ORAL
  Filled 2013-11-11 (×3): qty 1

## 2013-11-11 NOTE — Consult Note (Signed)
Patient ZO:XWRUEAVWU RODNISHA BLOMGREN      DOB: [birth year]      JWJ:[id number]     Consult Note from the Palliative Medicine Team at [facility] Requested by: Dr Sherral Hammers      PCP: Ruben Reason, MD Reason for Consultation:Clarification of [place] and options     Phone [phone]  Assessment of patients Current state: Kristin Jennings is a 80 y.o. female with a past medical history of atrial fibrillation, hypertension, cognitive impairment, brought to the emergency department with mental status changes.  Family reports cotinued decline physically, cognitively and functionally over the past 6 months Chest x-ray showed findings suggestive of early pneumonia, with CBC showing an elevated white count in the 50,000 range. She was started on empiric IV antibiotic therapy with ceftriaxone and azithromycin.  Also PMHx chronic anemia/pulmonary nodules/elevated total protein with low albumin/elevated immunoglobulins (worrisome for neoplasm) evaluated by Dr. Heath Lark (oncology) on the 08/08/2013 NOTE patient and family refused further workup   Meeting today with family and they verbalize understanding of overall limited prognosis and their desire for comfort.       Consult is for review of medical treatment options, clarification of goals of care and end of life issues, disposition and options, and symptom recommendation.  This NP Wadie Lessen reviewed medical records, received report from team, assessed the patient and then meet at the patient's bedside along with her daughter/main decision maker Odie Sera and grand daughter Marlowe Sax  to discuss diagnosis prognosis, [place], EOL wishes disposition and options.   A detailed discussion was had today regarding advanced directives.  Concepts specific to code status, artifical feeding and hydration, continued IV antibiotics and rehospitalization was had.  The difference between a aggressive medical intervention path  and a palliative  comfort care path for this patient at this time was had.  Values and goals of care important to patient and family were attempted to be elicited.  Concept of Hospice and Palliative Care were discussed  Natural trajectory and expectations at EOL were discussed.  Questions and concerns addressed.  Hard Choices booklet left for review. Family encouraged to call with questions or concerns.  PMT will continue to support holistically.  MOST form completed     Goals of Care: 1.  Code Status:  DNR/DNI-comfort is main focus of care   2. Scope of Treatment:  1.  Family would like to take the patietn home when medcially stable with the help of home health services.  They are not interested in hospice at this time.  They do not want any further work up for oncology concerns.    Comfort feeds, oral medications only and minimize to comfort, symptom management, no rehospitalization.  4. Disposition: Home with home health services   3. Symptom Management:   1.  Weakness/Failure toThrive: Comfort is main focus  2.  Dysphagia:  Diet as toelrated with known risk of aspiration   4. Psychosocial:  Emotional support offered to family at bedside.  5. Spiritual:  Strong community church support    Patient Documents Completed or Given: Document Given Completed  Advanced Directives Pkt    MOST  yes  DNR    Gone from [facility]    Hard Choices      Brief HPI: Kristin Jennings is a 80 y.o. female with a past medical history of atrial fibrillation, hypertension, cognitive impairment, brought to the emergency department with mental status changes.  Family reports cotinued decline physically, cognitively  and functionally over the past 6 months Chest x-ray showed findings suggestive of early pneumonia, with CBC showing an elevated white count in the 50,000 range. She was started on empiric IV antibiotic therapy with ceftriaxone and azithromycin.  Also PMHx chronic anemia/pulmonary nodules/elevated  total protein with low albumin/elevated immunoglobulins (worrisome for neoplasm) evaluated by Dr. Heath Lark (oncology) on the 08/08/2013 NOTE patient and family refused further workup   Meeting today with family and they verbalize understanding of overall limited prognosis and their desire for comfort.       ROS: denies pain or discomfort,     PMH:  Past Medical History  Diagnosis Date  . Arthritis   . Hypertension   . Atrial fibrillation   . Thyroid disease   . Anemia, unspecified 07/10/2013  . Weight loss 07/11/2013  . Liver lesion 07/11/2013  . Monoclonal paraproteinemia 07/25/2013     QIO:NGEXBMW reviewed. No pertinent past surgical history. I have reviewed the [place] and SH and  If appropriate update it with new information. Allergies  Allergen Reactions  . Lisinopril Cough   Scheduled Meds: . amiodarone  200 mg Oral BID  . aspirin EC  81 mg Oral Daily  . enoxaparin (LOVENOX) injection  40 mg Subcutaneous Q24H  . furosemide  40 mg Oral BID  . lactose free nutrition  237 mL Oral TID WC  . levothyroxine  75 mcg Oral QAC breakfast   Continuous Infusions: . sodium chloride 20 mL/hr (11/11/13 0754)   PRN Meds:.polyethylene glycol    BP 100/55  Pulse 80  Temp(Src) 97.4 F (36.3 C) (Oral)  Resp 18  Ht 5\' 7"  (1.702 m)  Wt 54.522 kg (120 lb 3.2 oz)  BMI 18.82 kg/m2  SpO2 87%   PPS:30 % at best   Intake/Output Summary (Last 24 hours) at 11/11/13 1423 Last data filed at 11/11/13 1309  Gross per 24 hour  Intake    980 ml  Output    200 ml  Net    780 ml     Physical Exam:  General: frail cachectic black female, NAD HEENT:  Mm, no exudate Chest:  Decreased in bases, bibasilar crackles, shallow breathing pattern CVS: irregualr Abdomen: soft NT +BS Ext: without edema Neuro: confused, pleasant and cooperative  Labs: CBC    Component Value Date/Time   WBC 4.8 11/11/2013 0430   WBC 6.4 07/25/2013 1234   WBC 6.8 06/19/2013 1351   RBC 3.79* 11/11/2013  0430   RBC 3.98 07/25/2013 1234   RBC 4.00* 06/19/2013 1351   HGB 8.9* 11/11/2013 0430   HGB 9.4* 07/25/2013 1234   HGB 9.1* 06/19/2013 1351   HCT 28.6* 11/11/2013 0430   HCT 30.8* 07/25/2013 1234   HCT 31.3* 06/19/2013 1351   PLT 424* 11/11/2013 0430   PLT 321 07/25/2013 1234   MCV 75.5* 11/11/2013 0430   MCV 77.4* 07/25/2013 1234   MCV 78.2* 06/19/2013 1351   MCH 23.5* 11/11/2013 0430   MCH 23.6* 07/25/2013 1234   MCH 22.8* 06/19/2013 1351   MCHC 31.1 11/11/2013 0430   MCHC 30.5* 07/25/2013 1234   MCHC 29.1* 06/19/2013 1351   RDW 21.5* 11/11/2013 0430   RDW 19.6* 07/25/2013 1234   LYMPHSABS 1.6 11/11/2013 0430   LYMPHSABS 1.6 07/25/2013 1234   MONOABS 0.7 11/11/2013 0430   MONOABS 0.6 07/25/2013 1234   EOSABS 0.0 11/11/2013 0430   EOSABS 0.0 07/25/2013 1234   BASOSABS 0.0 11/11/2013 0430   BASOSABS 0.0 07/25/2013 1234    BMET  Component Value Date/Time   NA 126* 11/11/2013 0430   NA 131* 07/25/2013 1234   K 4.1 11/11/2013 0430   K 4.8 07/25/2013 1234   CL 89* 11/11/2013 0430   CO2 25 11/11/2013 0430   CO2 25 07/25/2013 1234   GLUCOSE 87 11/11/2013 0430   GLUCOSE 115 07/25/2013 1234   BUN 16 11/11/2013 0430   BUN 21.9 07/25/2013 1234   CREATININE 0.93 11/11/2013 0430   CREATININE 0.9 07/25/2013 1234   CREATININE 0.66 06/19/2013 1349   CALCIUM 7.9* 11/11/2013 0430   CALCIUM 9.8 07/25/2013 1234   GFRNONAA 51* 11/11/2013 0430   GFRAA 59* 11/11/2013 0430    CMP     Component Value Date/Time   NA 126* 11/11/2013 0430   NA 131* 07/25/2013 1234   K 4.1 11/11/2013 0430   K 4.8 07/25/2013 1234   CL 89* 11/11/2013 0430   CO2 25 11/11/2013 0430   CO2 25 07/25/2013 1234   GLUCOSE 87 11/11/2013 0430   GLUCOSE 115 07/25/2013 1234   BUN 16 11/11/2013 0430   BUN 21.9 07/25/2013 1234   CREATININE 0.93 11/11/2013 0430   CREATININE 0.9 07/25/2013 1234   CREATININE 0.66 06/19/2013 1349   CALCIUM 7.9* 11/11/2013 0430   CALCIUM 9.8 07/25/2013 1234   PROT 7.6 11/11/2013 0430   PROT 9.2* 07/25/2013  1234   ALBUMIN 2.0* 11/11/2013 0430   ALBUMIN 2.3* 07/25/2013 1234   AST 17 11/11/2013 0430   AST 13 07/25/2013 1234   ALT 10 11/11/2013 0430   ALT 7 07/25/2013 1234   ALKPHOS 94 11/11/2013 0430   ALKPHOS 83 07/25/2013 1234   BILITOT 0.3 11/11/2013 0430   BILITOT 0.42 07/25/2013 1234   GFRNONAA 51* 11/11/2013 0430   GFRAA 59* 11/11/2013 0430       Time In Time Out Total Time Spent with Patient Total Overall Time  1130 1300 75 min 90 min    Greater than 50%  of this time was spent counseling and coordinating care related to the above assessment and plan.  Wadie Lessen NP  Palliative Medicine Team Team Phone # [phone] Pager [phone]  Discussed with Dr Sherral Hammers

## 2013-11-11 NOTE — Progress Notes (Signed)
[facility] PROGRESS NOTE  Kristin Jennings PXT:[id number] DOB: 1916-01-07 DOA: 11/08/2013 PCP: Ruben Reason, MD  Assessment/Plan:  Pulmonary edema (patient has CXR evidence of CHF)  -Daily a.m. weight on a standing scale  -Admission weight = 56.2 kg , 2/16 bed weight= 54.5 kg - Changed to PO Lasix 40 mg BID  - Effusion secondary to recent pneumonia vs CHF vs neoplasm  -ProBNP= 2437  -Echocardiogram see results below  -Troponin x3 negative  Pleural effusions bilateral  -See pulmonary edema   Hyponatremia  -Secondary to CHF vs neoplasm vs recent pneumonia vs infection (less likely)  -Patient overall fluid overloaded, normal saline at [facility] do not support diagnosis of SIADH   - 2/16 sodium trending up  Atrial fibrillation  -Rate controlled on amiodarone (new medication could be causing/exacerbating CHF) - Per cardiology Will continue amiodarone  Acute on chronic diastolic CHF -Patient last seen by [facility] cardiology 12/12/2012 for new onset atrial fibrillation -Considering the valvular damage/pulmonary hypertension/dilated ventricle and atrium most likely had CHF at that time. -Patient currently in decompensated CHF with diuresis  -Strict I and O. -Daily a.m. Weight  Pulmonary hypertension -Would normally start a beta blocker, ACE inhibitor and nitrate however patient's BP will not tolerate these agents. -Continue amiodarone which was started on patient's visit 80y.o.  Pericardial effusion -Currently not hemodynamically compromising. Cardiology concurred  HTN  -Currently controlled watch closely  Hypothyroidism -Increase Synthroid to 100 mcg daily  Microcytic anemia  -Refused workup from Dr. Heath Lark (oncology) on the 08/08/2013   Liver lesion  -Refused workup from Dr. Heath Lark (oncology) on the 08/08/2013   Multiple pulmonary lesion  -Refused workup from Dr. Heath Lark (oncology) on the 08/08/2013  - Family  met with palliative care and agreed to home health, PT/OT at home upon discharge    Code Status: DO NOT RESUSCITATE  Family Communication: Daughter at bedside for discussion of plan care  Disposition Plan: Discharge after diuresis   Procedure  Echocardiogram 11/09/2013 Left ventricle: The cavity size was normal. Systolic function was normal. Wall motion was normal; there were no regional wall motion abnormalities. - Right atrium: The atrium was mildly to moderately dilated. - Tricuspid valve: Moderate regurgitation. - Pulmonary arteries: Systolic pressure was moderately increased. PA peak pressure: 82m Hg (S). - Pericardium, extracardiac: A moderate pericardial effusion was identified circumferential to the heart. There was no evidence of hemodynamic compromise.     CXR 11/08/2013  1. New moderate bilateral pleural effusions with associated areas of  atelectasis and/or consolidation throughout the lung bases  bilaterally.    Antibiotics     Consultation   HPI/Subjective:  Kristin LAHMAN80y.o. BF PMHx chronic anemia/pulmonary nodules/elevated total protein with low albumin/elevated immunoglobulins (worrisome for neoplasm) evaluated by Dr. NHeath Lark(oncology) on the 08/08/2013 NOTE patient and family refused further workup  A- fibrillation, hypertension, multiple lung lesions, liver lesion, monoclonal paraproteinemia, multiple myeloma. [facility] for cognitive impairment, brought to the emergency department with mental status changes. Patient had told family members last Friday that she was not feeling well, over the weekend becoming progressively weaker, having minimal by mouth intake, less interactive, having an overall functional decline. This morning family members noted her to be increasingly confused and disoriented. family members also reported patient having a cough since November however has worsened over the last several days.  Her daughter stated noticing "shaking" however there was no postictal  period described by family members. In the emergency room she presented in atrial fibrillation with rapid ventricular response, having heart rates in the 120s and was started on Cardizem IV. Chest x-ray showed findings suggestive of early pneumonia, with CBC showing an elevated white count in the 50,000 range. She was started on empiric IV antibiotic therapy with ceftriaxone and azithromycin. Patient was discharged on 10/30/2013 on amiodarone for rate control of her RVR.  Presented to ED withpersistent decreased energy, inablty to get up and walk which may be due to this persistent SOB. Pt denies abd pain, CP, back pain, or any current pain. She reports her "stomach has been jumping." It seems as though she her labored breathing is noticeable with abdominal breathing as she is quite thin. I asked her several times and she denies abd pain. She has had a "little bit" of diarrhea, but family and pt cannot be more specific. Pt lives with granddaughter for significant time periods and she is not here. Pt's daughter stays with her some time, but cannot be more specific. No family members currently here can tell us specifically which medications she is on or taking. Pt wishes to remain DNR. Pt's Family states (+) fatigue/SOB since leaving hospital. Yesterday (+) LOC x 3 min while having BM. Family states that patient for the remainder of the day was positive SOB, negative CP. Family states that patient completed course of antibiotics post discharge on 10/30/2013 as instructed. 2/16 no acute events overnight patient remains fatigued, family agrees to meet with palliative care 2/17 patient states decrease SOB, but still fatigued.   Objective: Filed Vitals:   11/10/13 2116 11/11/13 0552 11/11/13 0629 11/11/13 1312  BP: 126/68 119/80  100/55  Pulse: 98 110  80  Temp: 98.1 F (36.7 C) 98.2 F (36.8 C)  97.4 F (36.3 C)  TempSrc: Oral Oral  Oral   Resp: '18 18  18  ' Height:      Weight:   54.522 kg (120 lb 3.2 oz)   SpO2: 96% 94%  87%    Intake/Output Summary (Last 24 hours) at 11/11/13 2033 Last data filed at 11/11/13 1800  Gross per 24 hour  Intake   1240 ml  Output    500 ml  Net    740 ml   Filed Weights   11/10/13 0613 11/10/13 1515 11/11/13 0629  Weight: 56.609 kg (124 lb 12.8 oz) 53.524 kg (118 lb) 54.522 kg (120 lb 3.2 oz)    Exam: General: A./O. x4, NAD Neck: Negative JVD, negative lymphadenopathy  Cardiovascular: Regular rhythm and rate, negative murmurs rubs or gallops  Respiratory: Decreased sounds bibasilar with crackles and diffuse expiratory wheezing; absent breath sounds in theRLL  Abdomen: Soft, nontender, nondistended, plus bowel sound  Skin: Negative lesions, negative rash  Musculoskeletal: Right pedal edema 1+ to knee, left pedal edema resolved      Data Reviewed: Basic Metabolic Panel:  Recent Labs Lab 11/08/13 1149 11/08/13 1634 11/09/13 0354 11/10/13 0524 11/11/13 0430  NA 122*  --  123* 125* 126*  K 4.3  --  4.6 4.4 4.1  CL 87*  --  88* 89* 89*  CO2 20  --  '19 22 25  ' GLUCOSE 94  --  87 125* 87  BUN 16  --  '15 19 16  ' CREATININE 0.64 0.61 0.71 0.95 0.93  CALCIUM 8.5  --  8.4 8.1* 7.9*  MG  --   --  1.4* 1.7 1.5   Liver Function Tests:  Recent  Labs Lab 11/08/13 1149 11/09/13 0354 11/10/13 0524 11/11/13 0430  AST '18 17 17 17  ' ALT '11 10 10 10  ' ALKPHOS 90 89 104 94  BILITOT 0.3 0.3 0.2* 0.3  PROT 8.5* 8.3 7.8 7.6  ALBUMIN 2.1* 2.1* 2.0* 2.0*   No results found for this basename: LIPASE, AMYLASE,  in the last 168 hours No results found for this basename: AMMONIA,  in the last 168 hours CBC:  Recent Labs Lab 11/08/13 1149 11/08/13 1634 11/09/13 0354 11/10/13 0524 11/11/13 0430  WBC 6.2 7.6 5.5 5.9 4.8  NEUTROABS 4.1  --  3.6 3.4 2.5  HGB 8.9* 9.3* 9.5* 8.5* 8.9*  HCT 28.6* 29.4* 30.0* 27.1* 28.6*  MCV 74.9* 74.8* 75.4* 75.1* 75.5*  PLT 430* 444* 359 445* 424*    Cardiac Enzymes:  Recent Labs Lab 11/08/13 1149 11/08/13 1634 11/08/13 2127 11/09/13 0350  TROPONINI <0.30 <0.30 <0.30 <0.30   BNP (last 3 results)  Recent Labs  10/26/13 2142 11/08/13 1149  PROBNP 2747.0* 2437.0*   CBG: No results found for this basename: GLUCAP,  in the last 168 hours  Recent Results (from the past 240 hour(s))  URINE CULTURE     Status: None   Collection Time    11/09/13  1:26 AM      Result Value Ref Range Status   Specimen Description URINE, RANDOM   Final   Special Requests NONE   Final   Culture  Setup Time     Final   Value: 11/09/2013 16:36     Performed at [facility]     Final   Value: NO GROWTH     Performed at [facility]   Culture     Final   Value: NO GROWTH     Performed at [facility]   Report Status 11/10/2013 FINAL   Final     Studies: Dg Swallowing Func-speech Pathology  11/10/2013   [place], CCC-SLP     11/10/2013  3:26 PM Objective Swallowing Evaluation: Bedside swallow evaluation  Patient Details  Name: Kristin Jennings MRN: [id number] Date of Birth: 03/27/1916  Today's Date: 11/10/2013 Time: 1220-1240 SLP Time Calculation (min): 20 min  Past Medical History:  Past Medical History  Diagnosis Date  . Arthritis   . Hypertension   . Atrial fibrillation   . Thyroid disease   . Anemia, unspecified 07/10/2013  . Weight loss 07/11/2013  . Liver lesion 07/11/2013  . Monoclonal paraproteinemia 07/25/2013   Past Surgical History: History reviewed. No pertinent past  surgical history. HPI:  Kristin Jennings 80 y.o. BF PMHx chronic anemia/pulmonary  nodules/elevated total protein with low albumin/elevated  immunoglobulins (worrisome for neoplasm) evaluated by Dr. Heath Lark (oncology) on the 08/08/2013, A- fibrillation,  hypertension, multiple lung lesions, liver lesion, monoclonal  paraproteinemia, multiple myeloma. Was admitted, to [facility] long  hospital on 10/26/2013 for cognitive  impairment, brought to the  emergency department with mental status changes. Patient had told  family members last Friday that she was not feeling well, over  the weekend becoming progressively weaker, having minimal by  mouth intake, less interactive, having an overall functional  decline. This morning family members noted her to be increasingly  confused and disoriented. family members also reported patient  having a cough since November however has worsened over the last  several days. Her daughter stated noticing "shaking" however  there was no postictal period described by family members. In the  emergency room she presented in atrial fibrillation with rapid  ventricular response, having heart rates in the 120s and was  started on Cardizem IV. Chest x-ray showed findings suggestive of  early pneumonia, with CBC showing an elevated white count in the  50,000 range. She was started on empiric IV antibiotic therapy  with ceftriaxone and azithromycin. Patient was discharged on  10/30/2013 on amiodarone for rate control of her RVR. Presented to  ED withpersistent decreased energy, inablty to get up and walk  which may be due to this persistent SOB. Pt denies abd pain, CP,  back pain, or any current pain. She reports her "stomach has been  jumping." It seems as though she her labored breathing is  noticeable with abdominal breathing as she is quite thin. I asked  her several times and she denies abd pain. She has had a "little  bit" of diarrhea, but family and pt cannot be more specific. Pt  lives with granddaughter for significant time periods and she is  not here. Pt's daughter stays with her some time, but cannot be  more specific. No family members currently here can tell us  specifically which medications she is on or taking. Pt wishes to  remain DNR. Pt's Family states (+) fatigue/SOB since leaving  hospital. Yesterday (+) LOC x 3 min while having BM. Family  states that patient for the remainder of the day was  positive  SOB, negative CP. Family states that patient completed course of  antibiotics post discharge on 10/30/2013 as instructed. BSE ordered  due to recent reports of difficulty swallowing.     Assessment / Plan / Recommendation Clinical Impression  Dysphagia Diagnosis: Mild oral phase dysphagia;Mild pharyngeal  phase dysphagia Clinical impression: Patient presents with a mild oropharyngeal  dysphagia characterized by oral delays with peicemealing of all  boluses and severe delays with soft solids due to missing  dentition, followed by a delay in swallow initiation resulting in  inconsistent flash penetration of liquid consistencies. No  aspiration noted. Mild pharyngeal residuals noted post swallow  clear with spontaneous dry swallows when provided with extra  time. At this time, overall function relatively age appropriate.  Risk of aspiration mild and likely will be exacerbated by any  acute illness or deconditioning. Recommend continuation of  current diet with advacement of solids once patient has dentures.  No SLP f/u indicated at this time.Education complete via phone  with patient's grandaughter regarding test results,  recommendations, and aspiration precautions.      Treatment Recommendation  No treatment recommended at this time    Diet Recommendation Thin liquid;Dysphagia 1 (Puree)   Liquid Administration via: Cup;Straw Medication Administration: Whole meds with liquid Supervision: Patient able to self feed;Full supervision/cueing  for compensatory strategies Compensations: Slow rate;Small sips/bites Postural Changes and/or Swallow Maneuvers: Seated upright 90  degrees;Upright 30-60 min after meal    Other  Recommendations Oral Care Recommendations: Oral care BID   Follow Up Recommendations  None               General Date of Onset: 11/08/13 HPI: Kristin Jennings 80 y.o. BF PMHx chronic anemia/pulmonary  nodules/elevated total protein with low albumin/elevated  immunoglobulins (worrisome for neoplasm)  evaluated by Dr. Heath Lark (oncology) on the 08/08/2013, A- fibrillation,  hypertension, multiple lung lesions, liver lesion, monoclonal  paraproteinemia, multiple myeloma. Was admitted, to [facility] on 10/26/2013 for cognitive impairment, brought to the  emergency department with mental status changes. Patient had  told  family members last Friday that she was not feeling well, over  the weekend becoming progressively weaker, having minimal by  mouth intake, less interactive, having an overall functional  decline. This morning family members noted her to be increasingly  confused and disoriented. family members also reported patient  having a cough since November however has worsened over the last  several days. Her daughter stated noticing "shaking" however  there was no postictal period described by family members. In the  emergency room she presented in atrial fibrillation with rapid  ventricular response, having heart rates in the 120s and was  started on Cardizem IV. Chest x-ray showed findings suggestive of  early pneumonia, with CBC showing an elevated white count in the  50,000 range. She was started on empiric IV antibiotic therapy  with ceftriaxone and azithromycin. Patient was discharged on  10/30/2013 on amiodarone for rate control of her RVR. Presented to  ED withpersistent decreased energy, inablty to get up and walk  which may be due to this persistent SOB. Pt denies abd pain, CP,  back pain, or any current pain. She reports her "stomach has been  jumping." It seems as though she her labored breathing is  noticeable with abdominal breathing as she is quite thin. I asked  her several times and she denies abd pain. She has had a "little  bit" of diarrhea, but family and pt cannot be more specific. Pt  lives with granddaughter for significant time periods and she is  not here. Pt's daughter stays with her some time, but cannot be  more specific. No family members currently here can tell us   specifically which medications she is on or taking. Pt wishes to  remain DNR. Pt's Family states (+) fatigue/SOB since leaving  hospital. Yesterday (+) LOC x 3 min while having BM. Family  states that patient for the remainder of the day was positive  SOB, negative CP. Family states that patient completed course of  antibiotics post discharge on 10/30/2013 as instructed. BSE ordered  due to recent reports of difficulty swallowing. Type of Study: Bedside swallow evaluation Reason for Referral: Objectively evaluate swallowing function Previous Swallow Assessment: BSE complete 2/14 indicated need for  MBS to objectively evaluate function Diet Prior to this Study: Dysphagia 1 (puree);Thin liquids Temperature Spikes Noted: No Respiratory Status: Room air History of Recent Intubation: No Behavior/Cognition: Alert;Cooperative;Pleasant mood Oral Cavity - Dentition: Dentures, not available (per patient,  dentures at home) Oral Motor / Sensory Function: Within functional limits Self-Feeding Abilities: Able to feed self Patient Positioning: Upright in chair Baseline Vocal Quality: Clear Volitional Swallow: Able to elicit Anatomy: Within functional limits Pharyngeal Secretions: Not observed secondary MBS    Reason for Referral Objectively evaluate swallowing function   Oral Phase Oral Preparation/Oral Phase Oral Phase: Impaired Oral - Nectar Oral - Nectar Cup: Lingual/palatal residue;Piecemeal swallowing Oral - Thin Oral - Thin Cup: Lingual/palatal residue;Piecemeal swallowing Oral - Thin Straw: Lingual/palatal residue;Piecemeal swallowing Oral - Solids Oral - Puree: Lingual/palatal residue;Piecemeal swallowing Oral - Mechanical Soft: Lingual/palatal residue;Piecemeal  swallowing;Delayed oral transit Oral - Pill: Delayed oral transit   Pharyngeal Phase Pharyngeal Phase Pharyngeal Phase: Impaired Pharyngeal - Nectar Pharyngeal - Nectar Cup: Delayed swallow initiation;Premature  spillage to valleculae;Pharyngeal residue -  valleculae;Pharyngeal  residue - pyriform sinuses;Reduced airway/laryngeal  closure;Penetration/Aspiration before swallow Penetration/Aspiration details (nectar cup): Material enters  airway, remains ABOVE vocal cords then ejected out Pharyngeal - Thin Pharyngeal - Thin Cup: Delayed swallow initiation;Premature  spillage to  valleculae;Pharyngeal residue - valleculae;Pharyngeal  residue - pyriform sinuses;Penetration/Aspiration before  swallow;Reduced airway/laryngeal closure Penetration/Aspiration details (thin cup): Material enters  airway, remains ABOVE vocal cords then ejected out (flash  penetration) Pharyngeal - Solids Pharyngeal - Puree: Delayed swallow initiation;Premature spillage  to valleculae;Pharyngeal residue - valleculae Pharyngeal - Mechanical Soft: Delayed swallow  initiation;Premature spillage to valleculae;Pharyngeal residue -  valleculae Pharyngeal - Pill: Delayed swallow initiation;Premature spillage  to valleculae  Cervical Esophageal Phase    GO    Cervical Esophageal Phase Cervical Esophageal Phase: WFL (? mild delayed transit through  lower, MD not present )        Gabriel Rainwater MA, CCC-SLP [phone]  [place] Leah Meryl 11/10/2013, 12:44 PM     Scheduled Meds: . amiodarone  200 mg Oral BID  . aspirin EC  81 mg Oral Daily  . enoxaparin (LOVENOX) injection  40 mg Subcutaneous Q24H  . furosemide  40 mg Oral BID  . lactose free nutrition  237 mL Oral TID WC  . levothyroxine  75 mcg Oral QAC breakfast   Continuous Infusions: . sodium chloride 20 mL/hr (11/11/13 0754)    Active Problems:   HTN (hypertension)   Hypothyroidism   Microcytic anemia   Anemia, unspecified   Multiple lung nodules   Liver lesion   Monoclonal paraproteinemia   Atrial fibrillation with RVR   Pleural effusion, bilateral   Bilateral pleural effusion   Multiple myeloma without remission   Diastolic CHF, acute on chronic   Pulmonary hypertension   Pericardial effusion    Time spent: 45  minutes    Javonta Gronau, J  [facility] Pager [phone]. If 7PM-7AM, please contact night-coverage at [email or web address], password [email or web address] 11/11/2013, 8:33 PM  LOS: 3 days

## 2013-11-12 ENCOUNTER — Inpatient Hospital Stay (HOSPITAL_COMMUNITY): Payer: Medicare Other

## 2013-11-12 DIAGNOSIS — R1310 Dysphagia, unspecified: Secondary | ICD-10-CM

## 2013-11-12 DIAGNOSIS — Z515 Encounter for palliative care: Secondary | ICD-10-CM

## 2013-11-12 DIAGNOSIS — R531 Weakness: Secondary | ICD-10-CM

## 2013-11-12 LAB — COMPREHENSIVE METABOLIC PANEL
ALK PHOS: 116 U/L (ref 39–117)
ALT: 10 U/L (ref 0–35)
AST: 15 U/L (ref 0–37)
Albumin: 2 g/dL — ABNORMAL LOW (ref 3.5–5.2)
BILIRUBIN TOTAL: 0.4 mg/dL (ref 0.3–1.2)
BUN: 17 mg/dL (ref 6–23)
CALCIUM: 8.1 mg/dL — AB (ref 8.4–10.5)
CO2: 24 meq/L (ref 19–32)
Chloride: 89 mEq/L — ABNORMAL LOW (ref 96–112)
Creatinine, Ser: 1.06 mg/dL (ref 0.50–1.10)
GFR calc non Af Amer: 43 mL/min — ABNORMAL LOW (ref >90)
GFR, EST AFRICAN AMERICAN: 50 mL/min — AB (ref >90)
GLUCOSE: 86 mg/dL (ref 70–99)
POTASSIUM: 4 meq/L (ref 3.7–5.3)
Sodium: 126 mEq/L — ABNORMAL LOW (ref 137–147)
Total Protein: 8 g/dL (ref 6.0–8.3)

## 2013-11-12 LAB — CBC WITH DIFFERENTIAL/PLATELET
BASOS PCT: 0 % (ref 0–1)
Basophils Absolute: 0 10*3/uL (ref 0.0–0.1)
EOS PCT: 0 % (ref 0–5)
Eosinophils Absolute: 0 10*3/uL (ref 0.0–0.7)
HCT: 26.9 % — ABNORMAL LOW (ref 36.0–46.0)
Hemoglobin: 8.5 g/dL — ABNORMAL LOW (ref 12.0–15.0)
LYMPHS ABS: 1.8 10*3/uL (ref 0.7–4.0)
Lymphocytes Relative: 37 % (ref 12–46)
MCH: 24.4 pg — AB (ref 26.0–34.0)
MCHC: 31.6 g/dL (ref 30.0–36.0)
MCV: 77.1 fL — AB (ref 78.0–100.0)
Monocytes Absolute: 0.7 10*3/uL (ref 0.1–1.0)
Monocytes Relative: 13 % — ABNORMAL HIGH (ref 3–12)
Neutro Abs: 2.4 10*3/uL (ref 1.7–7.7)
Neutrophils Relative %: 49 % (ref 43–77)
Platelets: 393 10*3/uL (ref 150–400)
RBC: 3.49 MIL/uL — AB (ref 3.87–5.11)
RDW: 21.8 % — AB (ref 11.5–15.5)
WBC: 4.9 10*3/uL (ref 4.0–10.5)

## 2013-11-12 LAB — TSH: TSH: 9.006 u[IU]/mL — ABNORMAL HIGH (ref 0.350–4.500)

## 2013-11-12 LAB — MAGNESIUM: MAGNESIUM: 1.6 mg/dL (ref 1.5–2.5)

## 2013-11-12 MED ORDER — ENOXAPARIN SODIUM 30 MG/0.3ML ~~LOC~~ SOLN
30.0000 mg | SUBCUTANEOUS | Status: DC
  Filled 2013-11-12: qty 0.3

## 2013-11-12 NOTE — Progress Notes (Signed)
I disagree with CTM's interpretation of beginning of shift strip.  They name it AFIB but I clearly see P waves that are .22 I

## 2013-11-12 NOTE — Progress Notes (Signed)
Spoke with pt's daughter Altha Harm concerning Home with Hospice. Christine selected [facility] for Home Care. Referral called to in house rep.

## 2013-11-12 NOTE — Progress Notes (Addendum)
[facility] PROGRESS NOTE  Kristin Jennings TLX:[id number] DOB: [birth year] DOA: 11/08/2013 PCP: Ruben Reason, MD  Assessment/Plan:  Pulmonary edema (patient has CXR evidence of CHF)  -Daily a.m. weight on a standing scale  -Admission weight = 56.2 kg , 2/16 bed weight= 54.5 kg - Changed to PO Lasix 40 mg BID  - Effusion secondary to recent pneumonia vs CHF vs neoplasm  -ProBNP= 2437  -Echocardiogram see results below  -Troponin x3 negative  Pleural effusions bilateral  -See pulmonary edema   Hyponatremia  -Secondary to CHF vs neoplasm vs recent pneumonia vs infection (less likely)  -Patient overall fluid overloaded, normal saline at [facility] -[place] do not support diagnosis of SIADH   - 2/16 sodium trending up  Atrial fibrillation  -Rate controlled on amiodarone (new medication could be causing/exacerbating CHF) - Per cardiology Will continue amiodarone  Acute on chronic diastolic CHF -Patient last seen by [facility] cardiology 12/12/2012 for new onset atrial fibrillation -Considering the valvular damage/pulmonary hypertension/dilated ventricle and atrium most likely had CHF at that time. -Patient currently in decompensated CHF with diuresis  -Strict I and O. -Daily a.m. Weight  Pulmonary hypertension -Would normally start a beta blocker, ACE inhibitor and nitrate however patient's BP will not tolerate these agents. -Continue amiodarone which was started on patient's visit approximately a week ago for her A. fib  Pericardial effusion -Currently not hemodynamically compromising. Cardiology concurred  HTN  -Currently controlled watch closely  Hypothyroidism -Increase Synthroid to 100 mcg daily  Microcytic anemia  -Refused workup from Dr. Heath Lark (oncology) on the 08/08/2013   Liver lesion  -Refused workup from Dr. Heath Lark (oncology) on the 08/08/2013   Multiple pulmonary lesion  -Refused workup from Dr. Heath Lark (oncology) on the 08/08/2013  - Family  met with palliative care and agreed to home health, PT/OT at home upon discharge    Code Status: DO NOT RESUSCITATE  Family Communication: Daughter at bedside for discussion of plan care  Disposition Plan: Discharge in am.    Procedure  Echocardiogram 11/09/2013 Left ventricle: The cavity size was normal. Systolic function was normal. Wall motion was normal; there were no regional wall motion abnormalities. - Right atrium: The atrium was mildly to moderately dilated. - Tricuspid valve: Moderate regurgitation. - Pulmonary arteries: Systolic pressure was moderately increased. PA peak pressure: 63m Hg (S). - Pericardium, extracardiac: A moderate pericardial effusion was identified circumferential to the heart. There was no evidence of hemodynamic compromise.     CXR 11/08/2013  1. New moderate bilateral pleural effusions with associated areas of  atelectasis and/or consolidation throughout the lung bases  bilaterally.    Antibiotics     Consultation   HPI/Subjective:  Wanted to go home, possibly in am.  Objective: Filed Vitals:   11/11/13 1312 11/11/13 2126 11/12/13 0525 11/12/13 1331  BP: 100/55 111/60 106/69 110/68  Pulse: 80 83 81 113  Temp: 97.4 F (36.3 C) 97.4 F (36.3 C) 97.8 F (36.6 C) 97.6 F (36.4 C)  TempSrc: Oral Oral Oral Oral  Resp: '18 18 20 18  ' Height:      Weight:   53.887 kg (118 lb 12.8 oz)   SpO2: 87% 92% 93% 89%    Intake/Output Summary (Last 24 hours) at 11/12/13 1555 Last data filed at 11/12/13 1100  Gross per 24 hour  Intake    980 ml  Output   1050 ml  Net    -70 ml   Filed Weights   11/10/13 1515 11/11/13 0629 11/12/13  0525  Weight: 53.524 kg (118 lb) 54.522 kg (120 lb 3.2 oz) 53.887 kg (118 lb 12.8 oz)    Exam: General: A./O. x4, NAD Neck: Negative JVD, negative lymphadenopathy  Cardiovascular: Regular rhythm and rate, negative murmurs rubs or gallops  Respiratory: Decreased sounds bibasilar with crackles and diffuse  expiratory wheezing; absent breath sounds in theRLL  Abdomen: Soft, nontender, nondistended, plus bowel sound  Skin: Negative lesions, negative rash  Musculoskeletal: Right pedal edema 1+ to knee, left pedal edema resolved      Data Reviewed: Basic Metabolic Panel:  Recent Labs Lab 11/08/13 1149 11/08/13 1634 11/09/13 0354 11/10/13 0524 11/11/13 0430 11/12/13 0436  NA 122*  --  123* 125* 126* 126*  K 4.3  --  4.6 4.4 4.1 4.0  CL 87*  --  88* 89* 89* 89*  CO2 20  --  '19 22 25 24  ' GLUCOSE 94  --  87 125* 87 86  BUN 16  --  '15 19 16 17  ' CREATININE 0.64 0.61 0.71 0.95 0.93 1.06  CALCIUM 8.5  --  8.4 8.1* 7.9* 8.1*  MG  --   --  1.4* 1.7 1.5 1.6   Liver Function Tests:  Recent Labs Lab 11/08/13 1149 11/09/13 0354 11/10/13 0524 11/11/13 0430 11/12/13 0436  AST '18 17 17 17 15  ' ALT '11 10 10 10 10  ' ALKPHOS 90 89 104 94 116  BILITOT 0.3 0.3 0.2* 0.3 0.4  PROT 8.5* 8.3 7.8 7.6 8.0  ALBUMIN 2.1* 2.1* 2.0* 2.0* 2.0*   No results found for this basename: LIPASE, AMYLASE,  in the last 168 hours No results found for this basename: AMMONIA,  in the last 168 hours CBC:  Recent Labs Lab 11/08/13 1149 11/08/13 1634 11/09/13 0354 11/10/13 0524 11/11/13 0430 11/12/13 0436  WBC 6.2 7.6 5.5 5.9 4.8 4.9  NEUTROABS 4.1  --  3.6 3.4 2.5 2.4  HGB 8.9* 9.3* 9.5* 8.5* 8.9* 8.5*  HCT 28.6* 29.4* 30.0* 27.1* 28.6* 26.9*  MCV 74.9* 74.8* 75.4* 75.1* 75.5* 77.1*  PLT 430* 444* 359 445* 424* 393   Cardiac Enzymes:  Recent Labs Lab 11/08/13 1149 11/08/13 1634 11/08/13 2127 11/09/13 0350  TROPONINI <0.30 <0.30 <0.30 <0.30   BNP (last 3 results)  Recent Labs  10/26/13 2142 11/08/13 1149  PROBNP 2747.0* 2437.0*   CBG: No results found for this basename: GLUCAP,  in the last 168 hours  Recent Results (from the past 240 hour(s))  URINE CULTURE     Status: None   Collection Time    11/09/13  1:26 AM      Result Value Ref Range Status   Specimen Description URINE,  RANDOM   Final   Special Requests NONE   Final   Culture  Setup Time     Final   Value: 11/09/2013 16:36     Performed at [place]     Final   Value: NO GROWTH     Performed at [facility]   Culture     Final   Value: NO GROWTH     Performed at [facility]   Report Status 11/10/2013 FINAL   Final     Studies: Dg Chest Port 1 View  11/12/2013   CLINICAL DATA:  CHF.  EXAM: PORTABLE CHEST - 1 VIEW  COMPARISON:  11/08/2013.  FINDINGS: Mild cardiomegaly. Bilateral airspace disease, right greater than left. This is worsened on the right since prior study. Bilateral  effusions again noted. No acute bony abnormality.  IMPRESSION: Bilateral airspace disease and effusions. Airspace disease on the right has worsened since prior study.   Electronically Signed   By: Rolm Baptise M.D.   On: 11/12/2013 05:31    Scheduled Meds: . amiodarone  200 mg Oral BID  . aspirin EC  81 mg Oral Daily  . enoxaparin (LOVENOX) injection  30 mg Subcutaneous Q24H  . furosemide  40 mg Oral BID  . lactose free nutrition  237 mL Oral TID WC  . levothyroxine  100 mcg Oral QAC breakfast   Continuous Infusions: . sodium chloride 20 mL/hr (11/11/13 0754)    Active Problems:   HTN (hypertension)   Hypothyroidism   Microcytic anemia   Anemia, unspecified   Multiple lung nodules   Liver lesion   Monoclonal paraproteinemia   Atrial fibrillation with RVR   Pleural effusion, bilateral   Bilateral pleural effusion   Multiple myeloma without remission   Diastolic CHF, acute on chronic   Pulmonary hypertension   Pericardial effusion   Palliative care encounter   Dysphagia, unspecified(787.20)   Weakness generalized    Time spent: 25 minutes    Deane Wattenbarger  [facility] Pager [phone] If 7PM-7AM, please contact night-coverage at [email or web address], password [facility] 11/12/2013, 3:55 PM  LOS: 4 days

## 2013-11-12 NOTE — Progress Notes (Signed)
Progress Note from the Palliative Medicine Team at [facility]:  patient is wake and oriented to person and place, family at bedside  -continued conversation regarding GOC and options,  Comfort is main focus of care  -family has made decision on Dr Karleen Hampshire recommendation that hospice is a good choice for patient at this time, I will write for choice      Objective: Allergies  Allergen Reactions  . Lisinopril Cough   Scheduled Meds: . amiodarone  200 mg Oral BID  . aspirin EC  81 mg Oral Daily  . furosemide  40 mg Oral BID  . lactose free nutrition  237 mL Oral TID WC  . levothyroxine  100 mcg Oral QAC breakfast   Continuous Infusions: . sodium chloride 20 mL/hr (11/11/13 0754)   PRN Meds:.polyethylene glycol  BP 110/68  Pulse 113  Temp(Src) 97.6 F (36.4 C) (Oral)  Resp 18  Ht 5\' 7"  (1.702 m)  Wt 53.887 kg (118 lb 12.8 oz)  BMI 18.60 kg/m2  SpO2 89%   PPS:30 %  Pain Score:denies    Intake/Output Summary (Last 24 hours) at 11/12/13 1607 Last data filed at 11/12/13 1100  Gross per 24 hour  Intake    980 ml  Output   1050 ml  Net    -70 ml       Physical Exam:  General: frail cachectic black female, NAD  HEENT: Mm, no exudate, positive muscle wasting  Chest: Decreased in bases, bibasilar crackles, shallow breathing pattern  CVS: irregualr  Abdomen: soft NT +BS  Ext: without edema  Neuro: confused, pleasant and cooperative   Labs: CBC    Component Value Date/Time   WBC 4.9 11/12/2013 0436   WBC 6.4 07/25/2013 1234   WBC 6.8 06/19/2013 1351   RBC 3.49* 11/12/2013 0436   RBC 3.98 07/25/2013 1234   RBC 4.00* 06/19/2013 1351   HGB 8.5* 11/12/2013 0436   HGB 9.4* 07/25/2013 1234   HGB 9.1* 06/19/2013 1351   HCT 26.9* 11/12/2013 0436   HCT 30.8* 07/25/2013 1234   HCT 31.3* 06/19/2013 1351   PLT 393 11/12/2013 0436   PLT 321 07/25/2013 1234   MCV 77.1* 11/12/2013 0436   MCV 77.4* 07/25/2013 1234   MCV 78.2* 06/19/2013 1351   MCH 24.4* 11/12/2013  0436   MCH 23.6* 07/25/2013 1234   MCH 22.8* 06/19/2013 1351   MCHC 31.6 11/12/2013 0436   MCHC 30.5* 07/25/2013 1234   MCHC 29.1* 06/19/2013 1351   RDW 21.8* 11/12/2013 0436   RDW 19.6* 07/25/2013 1234   LYMPHSABS 1.8 11/12/2013 0436   LYMPHSABS 1.6 07/25/2013 1234   MONOABS 0.7 11/12/2013 0436   MONOABS 0.6 07/25/2013 1234   EOSABS 0.0 11/12/2013 0436   EOSABS 0.0 07/25/2013 1234   BASOSABS 0.0 11/12/2013 0436   BASOSABS 0.0 07/25/2013 1234    BMET    Component Value Date/Time   NA 126* 11/12/2013 0436   NA 131* 07/25/2013 1234   K 4.0 11/12/2013 0436   K 4.8 07/25/2013 1234   CL 89* 11/12/2013 0436   CO2 24 11/12/2013 0436   CO2 25 07/25/2013 1234   GLUCOSE 86 11/12/2013 0436   GLUCOSE 115 07/25/2013 1234   BUN 17 11/12/2013 0436   BUN 21.9 07/25/2013 1234   CREATININE 1.06 11/12/2013 0436   CREATININE 0.9 07/25/2013 1234   CREATININE 0.66 06/19/2013 1349   CALCIUM 8.1* 11/12/2013 0436   CALCIUM 9.8 07/25/2013 1234   GFRNONAA 43* 11/12/2013 0436  GFRAA 50* 11/12/2013 0436    CMP     Component Value Date/Time   NA 126* 11/12/2013 0436   NA 131* 07/25/2013 1234   K 4.0 11/12/2013 0436   K 4.8 07/25/2013 1234   CL 89* 11/12/2013 0436   CO2 24 11/12/2013 0436   CO2 25 07/25/2013 1234   GLUCOSE 86 11/12/2013 0436   GLUCOSE 115 07/25/2013 1234   BUN 17 11/12/2013 0436   BUN 21.9 07/25/2013 1234   CREATININE 1.06 11/12/2013 0436   CREATININE 0.9 07/25/2013 1234   CREATININE 0.66 06/19/2013 1349   CALCIUM 8.1* 11/12/2013 0436   CALCIUM 9.8 07/25/2013 1234   PROT 8.0 11/12/2013 0436   PROT 9.2* 07/25/2013 1234   ALBUMIN 2.0* 11/12/2013 0436   ALBUMIN 2.3* 07/25/2013 1234   AST 15 11/12/2013 0436   AST 13 07/25/2013 1234   ALT 10 11/12/2013 0436   ALT 7 07/25/2013 1234   ALKPHOS 116 11/12/2013 0436   ALKPHOS 83 07/25/2013 1234   BILITOT 0.4 11/12/2013 0436   BILITOT 0.42 07/25/2013 1234   GFRNONAA 43* 11/12/2013 0436   GFRAA 50* 11/12/2013 0436       Assessment and Plan: 1. Code  Status:DNR/DNI-comfort is main focus of care       -maximize comfort by no further labs,  or diagnostics, minimize meds, comfort feeds as tolerated   2. Symptom Control:       Weakness/Failure toThrive: Comfort is main focus         Dysphagia: Diet as toelrated with known risk of aspiration 3.    Psychosocial: Emotional support offered to family at bedside.  4.   Spiritual: Strong community church support  5.  Disposition:  Family is now hopeful for hospice services at home will writer for choice  Patient Documents Completed or Given: Document Given Completed  Advanced Directives Pkt    MOST  yes  DNR    Gone from My [facility]ight    Hard Choices      Time In Time Out Total Time Spent with Patient Total Overall Time  1605 1640 35 min 35 min    Greater than 50%  of this time was spent counseling and coordinating care related to the above assessment and plan.  Wadie Lessen NP  Palliative Medicine Team Team Phone # [phone] Pager [phone]  Discussed with Dr Karleen Hampshire 1

## 2013-11-12 NOTE — Progress Notes (Signed)
Karanga from [facility] calls to report that pt is no longer in SR 1dHB her P wave is now .20 but rhythmremains irregular.   She reports that pt has been in an out of Afib all day"  but has had P waves since she came on at 2000.

## 2013-11-13 MED ORDER — LEVOTHYROXINE SODIUM 100 MCG PO TABS: 100.0000 ug | ORAL_TABLET | Freq: Every day | ORAL | Status: AC

## 2013-11-13 MED ORDER — FUROSEMIDE 40 MG PO TABS
40.0000 mg | ORAL_TABLET | Freq: Two times a day (BID) | ORAL | Status: AC
Start: 2013-11-13 — End: ?

## 2013-11-13 NOTE — Progress Notes (Signed)
Notified by Shirlean Kelly CMRN, patient and family request services of Hospice and [facility]) after discharge.   Patient information reviewed with Dr Alferd Patee, [facility] Director hospice eligible with dx: CHF(428.0) .  Spoke with pt, daughter Shirley Muscat at bedside this morning to initiate education related to hospice services, philosophy and team approach to care; they voiced good understanding of information provided.  Per notes/discussion plan is to d/c today by non-emergent transport; this morning O2 sats were down to 77% -pt started on O2 @ 2LNC  with improvement  -per discussion pt will go home with O2 - family plans to be in room today with pt until transport arrives - family may need a travel O2 tank brought to the room for family to take to the home to assure O2 is in the home when pt gets there  - family request Joice contact them in pt room to arrange for delivery of O2 today - also granddaughter Caren Griffins c: [phone] is a second contact  family requests to keep foley in at discharge for comfort *Please send completed GOLD DNR FORM home with pt  DME requested: Complete O2 Package B -O2 @ 2LNC for comfort; add light weight wheelchair; they decline a hospital bed or other DME at this time Request/information left on voice message for [facility] -contact Caren Griffins granddaughter [phone]; grandtr and dtr are both in pt's room currently  Family is requesting services of Alvester Chou NP with [facility] as it has been difficult to get pt to office visits now with decline pt is bedbound Initial paperwork faxed to [facility]  Completed d/c summary will need to be faxed to [facility] @ [phone] when final Please notify [facility] when patient is ready to leave unit at d/c call [phone] (or [phone] if after 5 pm);  [facility] information and contact numbers also given to daughter Altha Harm during visit.    Above information shared with Shirlean Kelly [facility] Please call with any questions or concerns   Danton Sewer, RN 11/13/2013, 10:04 AM [facility] Liaison [phone]

## 2013-11-13 NOTE — Discharge Summary (Signed)
Physician Discharge Summary  Kristin Jennings AUQ:[id number] DOB: 1916-07-12 DOA: 11/08/2013  PCP: Ruben Reason, MD  Admit date: 11/08/2013 Discharge date: 11/13/2013  Time spent: 30 minutes  Recommendations for Outpatient Follow-up:  1. Follow up with hospice.  2. Follow up with PCP as needed  Discharge Diagnoses:  Active Problems:   HTN (hypertension)   Hypothyroidism   Microcytic anemia   Anemia, unspecified   Multiple lung nodules   Liver lesion   Monoclonal paraproteinemia   Atrial fibrillation with RVR   Pleural effusion, bilateral   Bilateral pleural effusion   Multiple myeloma without remission   Diastolic CHF, acute on chronic   Pulmonary hypertension   Pericardial effusion   Palliative care encounter   Dysphagia, unspecified(787.20)   Weakness generalized   Discharge Condition: improved.   Diet recommendation: low sodium diet  Filed Weights   11/11/13 0629 11/12/13 0525 11/13/13 0503  Weight: 54.522 kg (120 lb 3.2 oz) 53.887 kg (118 lb 12.8 oz) 53.797 kg (118 lb 9.6 oz)    History of present illness:   Kristin Jennings 80 y.o. BF PMHx chronic anemia/pulmonary nodules/elevated total protein with low albumin/elevated immunoglobulins (worrisome for neoplasm) evaluated by Dr. Heath Lark (oncology) on the 08/08/2013 NOTE patient and family refused further workup  A- fibrillation, hypertension, multiple lung lesions, liver lesion, monoclonal paraproteinemia, multiple myeloma. Was admitted, to [facility] hospital on 10/26/2013 for cognitive impairment, brought to the emergency department with mental status changes. Patient had told family members last Friday that she was not feeling well, over the weekend becoming progressively weaker, having minimal by mouth intake, less interactive, having an overall functional decline. This morning family members noted her to be increasingly confused and disoriented. family members also reported patient having a cough since  November however has worsened over the last several days. She waas admitted for management of acute on chronic diastolic heart failure started on lasix. Family wanted comfort care and they have refused further work up. She iwas discharged home with home hospice.   Hospital Course:  Pulmonary edema  ON  PO Lasix 40 mg BID  - Effusion secondary to recent pneumonia vs CHF vs neoplasm  -ProBNP= 2437  -Troponin x3 negative  - She is clinically much better and we will continue that on discharge, .  Pleural effusions bilateral  -See pulmonary edema  Hyponatremia  -Secondary to CHF vs neoplasm vs recent pneumonia vs infection (less likely)  -Patient overall fluid overloaded, normal saline at [facility]  -[place] do not support diagnosis of SIADH  - pt family does not want any further work up.  Atrial fibrillation  -Rate controlled on amiodarone (new medication could be causing/exacerbating CHF)  - Per cardiology Will continue amiodarone  Acute on chronic diastolic CHF  -Patient last seen by [facility] cardiology 12/12/2012 for new onset atrial fibrillation  -Considering the valvular damage/pulmonary hypertension/dilated ventricle and atrium most likely had CHF at that time.  - she was discharged on lasix.  Pulmonary hypertension  -Would normally start a beta blocker, ACE inhibitor and nitrate however patient's BP will not tolerate these agents.  -Continue amiodarone which was started on patient's visit approximately a week ago for her A. fib  Pericardial effusion  -Currently not hemodynamically compromising. Cardiology concurred  HTN  -Currently controlled watch closely  Hypothyroidism  -Increase Synthroid to 100 mcg daily  Microcytic anemia  -Refused workup from Dr. Heath Lark (oncology) on the 08/08/2013  Liver lesion  -Refused workup from Dr. Heath Lark (oncology) on  the 08/08/2013  Multiple pulmonary lesion  -Refused workup from Dr. Heath Lark (oncology) on the 08/08/2013  - Family met with  palliative care and agreed to home health, HOME HOSPICE at home upon discharge Palliative consulted, family wanted comfort care, minimize the medications.   Procedures:  CXR  Consultations:  Palliative care consult.   Discharge Exam: Filed Vitals:   11/13/13 0620  BP: 110/74  Pulse: 81  Temp: 97.6 F (36.4 C)  Resp: 18   General: A./O. x4, NAD  Neck: Negative JVD, negative lymphadenopathy  Cardiovascular: Regular rhythm and rate, negative murmurs rubs or gallops  Respiratory: Decreased sounds bilateral,  absent breath sounds in theRLL  Abdomen: Soft, nontender, nondistended, plus bowel sound  Skin: Negative lesions, negative rash  Musculoskeletal: Right pedal edema 1+ to knee, left pedal edema resolved     Discharge Instructions      Discharge Orders   Future Orders Complete By Expires   Discharge instructions  As directed    Comments:     Home with home hospice and [facility] Follow upwith PCP as needed.       Medication List    STOP taking these medications       azithromycin 500 MG tablet  Commonly known as:  ZITHROMAX     cefpodoxime 200 MG tablet  Commonly known as:  VANTIN     hydrALAZINE 10 MG tablet  Commonly known as:  APRESOLINE      TAKE these medications       acetaminophen 325 MG tablet  Commonly known as:  TYLENOL  Take 325 mg by mouth every 6 (six) hours as needed for mild pain.     amiodarone 200 MG tablet  Commonly known as:  PACERONE  Take 1 tablet (200 mg total) by mouth 2 (two) times daily.     aspirin 81 MG tablet  Take 81 mg by mouth 2 (two) times daily.     furosemide 40 MG tablet  Commonly known as:  LASIX  Take 1 tablet (40 mg total) by mouth 2 (two) times daily.     lactose free nutrition Liqd  Take 237 mLs by mouth 3 (three) times daily with meals.     levothyroxine 100 MCG tablet  Commonly known as:  SYNTHROID, LEVOTHROID  Take 1 tablet (100 mcg total) by mouth daily before breakfast.     polyethylene glycol  packet  Commonly known as:  MIRALAX / GLYCOLAX  Take 17 g by mouth daily as needed for moderate constipation.       Allergies  Allergen Reactions  . Lisinopril Cough   Follow-up Information   [facility] ([facility] to follow aftr d/c pls notify whn transport on unit & pt ready to leave call [phone] (or [phone] aftr 5pm))    Contact information:   [facility] [place] [phone]      Follow up with HOPPER,DAVID, MD. (As needed)    Specialty:  Family Medicine   Contact information:   [place] [address] [phone]        The results of significant diagnostics from this hospitalization (including imaging, microbiology, ancillary and laboratory) are listed below for reference.    Significant Diagnostic Studies: Dg Chest 2 View  11/08/2013   CLINICAL DATA:  Shortness of breath.  Weakness.  Hypoxia.  EXAM: CHEST  2 VIEW  COMPARISON:  Chest x-ray 10/26/2013.  FINDINGS: Lung volumes are low. There are extensive bibasilar opacities  which may reflect areas of atelectasis and/or consolidation. Moderate bilateral pleural effusions. Bilateral apical nodular pleuroparenchymal thickening, most compatible with chronic post infectious or inflammatory scarring. Numerous pulmonary nodules noted on prior CT scan 07/24/2013 are not well demonstrated by plain film evaluation. However, those nodules or stable compared to remote prior studies on the CT examination, indicative of benign pathology.  IMPRESSION: 1. New moderate bilateral pleural effusions with associated areas of atelectasis and/or consolidation throughout the lung bases bilaterally.   Electronically Signed   By: Vinnie Langton M.D.   On: 11/08/2013 12:31   Dg Chest 2 View  10/26/2013   CLINICAL DATA:  Altered mental status  EXAM: CHEST  2 VIEW  COMPARISON:  CT CHEST W/CM dated 07/24/2013; CT CHEST W/CM dated 03/25/2013; DG CHEST 2V dated 03/25/2013   FINDINGS: The lungs are hyperinflated likely secondary to COPD. There is hazy right lower lobe airspace disease. There is no pleural effusion or pneumothorax. Stable mild cardiomegaly.  The osseous structures are unremarkable.  IMPRESSION: Hazy right lower lobe airspace disease which may represent atelectasis versus developing pneumonia.   Electronically Signed   By: Kathreen Devoid   On: 10/26/2013 15:45   Ct Head Wo Contrast  10/26/2013   CLINICAL DATA:  Post seizure  EXAM: CT HEAD WITHOUT CONTRAST  TECHNIQUE: Contiguous axial images were obtained from the base of the skull through the vertex without intravenous contrast.  COMPARISON:  None  FINDINGS: Generalized atrophy.  Normal ventricular morphology.  No midline shift or mass effect.  Low attenuation along the interhemispheric fissure 6 mm thick question lipoma falx.  No intracranial hemorrhage, additional mass lesion or evidence acute infarction.  No extra-axial fluid collections.  Bones appear demineralized.  No acute bone or sinus abnormalities.  IMPRESSION: Fat attenuation along the interhemispheric fissure question lipoma of the falx.  No acute intracranial abnormalities identified.   Electronically Signed   By: Lavonia Dana M.D.   On: 10/26/2013 15:20   Dg Chest Port 1 View  11/12/2013   CLINICAL DATA:  CHF.  EXAM: PORTABLE CHEST - 1 VIEW  COMPARISON:  11/08/2013.  FINDINGS: Mild cardiomegaly. Bilateral airspace disease, right greater than left. This is worsened on the right since prior study. Bilateral effusions again noted. No acute bony abnormality.  IMPRESSION: Bilateral airspace disease and effusions. Airspace disease on the right has worsened since prior study.   Electronically Signed   By: Rolm Baptise M.D.   On: 11/12/2013 05:31   Dg Swallowing Func-speech Pathology  11/10/2013   [facility], CCC-SLP     11/10/2013  3:26 PM Objective Swallowing Evaluation: Bedside swallow evaluation  Patient Details  Name: Kristin Jennings MRN:  [id number] Date of Birth: [birth year]  Today's Date: 11/10/2013 Time: 1220-1240 SLP Time Calculation (min): 20 min  Past Medical History:  Past Medical History  Diagnosis Date  . Arthritis   . Hypertension   . Atrial fibrillation   . Thyroid disease   . Anemia, unspecified 07/10/2013  . Weight loss 07/11/2013  . Liver lesion 07/11/2013  . Monoclonal paraproteinemia 07/25/2013   Past Surgical History: History reviewed. No pertinent past  surgical history. HPI:  RAYCHELL HOLCOMB 80 y.o. BF PMHx chronic anemia/pulmonary  nodules/elevated total protein with low albumin/elevated  immunoglobulins (worrisome for neoplasm) evaluated by Dr. Heath Lark (oncology) on the 08/08/2013, A- fibrillation,  hypertension, multiple lung lesions, liver lesion, monoclonal  paraproteinemia, multiple myeloma. Was admitted, to [facility] long  hospital on 10/26/2013 for cognitive impairment, brought  to the  emergency department with mental status changes. Patient had told  family members last Friday that she was not feeling well, over  the weekend becoming progressively weaker, having minimal by  mouth intake, less interactive, having an overall functional  decline. This morning family members noted her to be increasingly  confused and disoriented. family members also reported patient  having a cough since November however has worsened over the last  several days. Her daughter stated noticing "shaking" however  there was no postictal period described by family members. In the  emergency room she presented in atrial fibrillation with rapid  ventricular response, having heart rates in the 120s and was  started on Cardizem IV. Chest x-ray showed findings suggestive of  early pneumonia, with CBC showing an elevated white count in the  50,000 range. She was started on empiric IV antibiotic therapy  with ceftriaxone and azithromycin. Patient was discharged on  10/30/2013 on amiodarone for rate control of her RVR. Presented to  ED withpersistent  decreased energy, inablty to get up and walk  which may be due to this persistent SOB. Pt denies abd pain, CP,  back pain, or any current pain. She reports her "stomach has been  jumping." It seems as though she her labored breathing is  noticeable with abdominal breathing as she is quite thin. I asked  her several times and she denies abd pain. She has had a "little  bit" of diarrhea, but family and pt cannot be more specific. Pt  lives with granddaughter for significant time periods and she is  not here. Pt's daughter stays with her some time, but cannot be  more specific. No family members currently here can tell us  specifically which medications she is on or taking. Pt wishes to  remain DNR. Pt's Family states (+) fatigue/SOB since leaving  hospital. Yesterday (+) LOC x 3 min while having BM. Family  states that patient for the remainder of the day was positive  SOB, negative CP. Family states that patient completed course of  antibiotics post discharge on 10/30/2013 as instructed. BSE ordered  due to recent reports of difficulty swallowing.     Assessment / Plan / Recommendation Clinical Impression  Dysphagia Diagnosis: Mild oral phase dysphagia;Mild pharyngeal  phase dysphagia Clinical impression: Patient presents with a mild oropharyngeal  dysphagia characterized by oral delays with peicemealing of all  boluses and severe delays with soft solids due to missing  dentition, followed by a delay in swallow initiation resulting in  inconsistent flash penetration of liquid consistencies. No  aspiration noted. Mild pharyngeal residuals noted post swallow  clear with spontaneous dry swallows when provided with extra  time. At this time, overall function relatively age appropriate.  Risk of aspiration mild and likely will be exacerbated by any  acute illness or deconditioning. Recommend continuation of  current diet with advacement of solids once patient has dentures.  No SLP f/u indicated at this time.Education  complete via phone  with patient's grandaughter regarding test results,  recommendations, and aspiration precautions.      Treatment Recommendation  No treatment recommended at this time    Diet Recommendation Thin liquid;Dysphagia 1 (Puree)   Liquid Administration via: Cup;Straw Medication Administration: Whole meds with liquid Supervision: Patient able to self feed;Full supervision/cueing  for compensatory strategies Compensations: Slow rate;Small sips/bites Postural Changes and/or Swallow Maneuvers: Seated upright 90  degrees;Upright 30-60 min after meal    Other  Recommendations Oral Care Recommendations: Oral care BID  Follow Up Recommendations  None               General Date of Onset: 11/08/13 HPI: Kristin Jennings 80 y.o. BF PMHx chronic anemia/pulmonary  nodules/elevated total protein with low albumin/elevated  immunoglobulins (worrisome for neoplasm) evaluated by Dr. Heath Lark (oncology) on the 08/08/2013, A- fibrillation,  hypertension, multiple lung lesions, liver lesion, monoclonal  paraproteinemia, multiple myeloma. Was admitted, to [facility] long  hospital on 10/26/2013 for cognitive impairment, brought to the  emergency department with mental status changes. Patient had told  family members last Friday that she was not feeling well, over  the weekend becoming progressively weaker, having minimal by  mouth intake, less interactive, having an overall functional  decline. This morning family members noted her to be increasingly  confused and disoriented. family members also reported patient  having a cough since November however has worsened over the last  several days. Her daughter stated noticing "shaking" however  there was no postictal period described by family members. In the  emergency room she presented in atrial fibrillation with rapid  ventricular response, having heart rates in the 120s and was  started on Cardizem IV. Chest x-ray showed findings suggestive of  early pneumonia, with CBC  showing an elevated white count in the  50,000 range. She was started on empiric IV antibiotic therapy  with ceftriaxone and azithromycin. Patient was discharged on  10/30/2013 on amiodarone for rate control of her RVR. Presented to  ED withpersistent decreased energy, inablty to get up and walk  which may be due to this persistent SOB. Pt denies abd pain, CP,  back pain, or any current pain. She reports her "stomach has been  jumping." It seems as though she her labored breathing is  noticeable with abdominal breathing as she is quite thin. I asked  her several times and she denies abd pain. She has had a "little  bit" of diarrhea, but family and pt cannot be more specific. Pt  lives with granddaughter for significant time periods and she is  not here. Pt's daughter stays with her some time, but cannot be  more specific. No family members currently here can tell us  specifically which medications she is on or taking. Pt wishes to  remain DNR. Pt's Family states (+) fatigue/SOB since leaving  hospital. Yesterday (+) LOC x 3 min while having BM. Family  states that patient for the remainder of the day was positive  SOB, negative CP. Family states that patient completed course of  antibiotics post discharge on 10/30/2013 as instructed. BSE ordered  due to recent reports of difficulty swallowing. Type of Study: Bedside swallow evaluation Reason for Referral: Objectively evaluate swallowing function Previous Swallow Assessment: BSE complete 2/14 indicated need for  MBS to objectively evaluate function Diet Prior to this Study: Dysphagia 1 (puree);Thin liquids Temperature Spikes Noted: No Respiratory Status: Room air History of Recent Intubation: No Behavior/Cognition: Alert;Cooperative;Pleasant mood Oral Cavity - Dentition: Dentures, not available (per patient,  dentures at home) Oral Motor / Sensory Function: Within functional limits Self-Feeding Abilities: Able to feed self Patient Positioning: Upright in chair Baseline  Vocal Quality: Clear Volitional Swallow: Able to elicit Anatomy: Within functional limits Pharyngeal Secretions: Not observed secondary MBS    Reason for Referral Objectively evaluate swallowing function   Oral Phase Oral Preparation/Oral Phase Oral Phase: Impaired Oral - Nectar Oral - Nectar Cup: Lingual/palatal residue;Piecemeal swallowing Oral - Thin Oral - Thin Cup: Lingual/palatal residue;Piecemeal swallowing Oral -  Thin Straw: Lingual/palatal residue;Piecemeal swallowing Oral - Solids Oral - Puree: Lingual/palatal residue;Piecemeal swallowing Oral - Mechanical Soft: Lingual/palatal residue;Piecemeal  swallowing;Delayed oral transit Oral - Pill: Delayed oral transit   Pharyngeal Phase Pharyngeal Phase Pharyngeal Phase: Impaired Pharyngeal - Nectar Pharyngeal - Nectar Cup: Delayed swallow initiation;Premature  spillage to valleculae;Pharyngeal residue - valleculae;Pharyngeal  residue - pyriform sinuses;Reduced airway/laryngeal  closure;Penetration/Aspiration before swallow Penetration/Aspiration details (nectar cup): Material enters  airway, remains ABOVE vocal cords then ejected out Pharyngeal - Thin Pharyngeal - Thin Cup: Delayed swallow initiation;Premature  spillage to valleculae;Pharyngeal residue - valleculae;Pharyngeal  residue - pyriform sinuses;Penetration/Aspiration before  swallow;Reduced airway/laryngeal closure Penetration/Aspiration details (thin cup): Material enters  airway, remains ABOVE vocal cords then ejected out (flash  penetration) Pharyngeal - Solids Pharyngeal - Puree: Delayed swallow initiation;Premature spillage  to valleculae;Pharyngeal residue - valleculae Pharyngeal - Mechanical Soft: Delayed swallow  initiation;Premature spillage to valleculae;Pharyngeal residue -  valleculae Pharyngeal - Pill: Delayed swallow initiation;Premature spillage  to valleculae  Cervical Esophageal Phase    GO    Cervical Esophageal Phase Cervical Esophageal Phase: WFL (? mild delayed transit through   lower, MD not present )        Gabriel Rainwater MA, CCC-SLP [phone]  [place] Leah Meryl 11/10/2013, 12:44 PM     Microbiology: Recent Results (from the past 240 hour(s))  URINE CULTURE     Status: None   Collection Time    11/09/13  1:26 AM      Result Value Ref Range Status   Specimen Description URINE, RANDOM   Final   Special Requests NONE   Final   Culture  Setup Time     Final   Value: 11/09/2013 16:36     Performed at [place]     Final   Value: NO GROWTH     Performed at [facility]   Culture     Final   Value: NO GROWTH     Performed at [facility]   Report Status 11/10/2013 FINAL   Final     Labs: Basic Metabolic Panel:  Recent Labs Lab 11/08/13 1149 11/08/13 1634 11/09/13 0354 11/10/13 0524 11/11/13 0430 11/12/13 0436  NA 122*  --  123* 125* 126* 126*  K 4.3  --  4.6 4.4 4.1 4.0  CL 87*  --  88* 89* 89* 89*  CO2 20  --  _0 GLUCOSE 94  --  87 125* 87 86  BUN 16  --  _1 CREATININE 0.64 0.61 0.71 0.95 0.93 1.06  CALCIUM 8.5  --  8.4 8.1* 7.9* 8.1*  MG  --   --  1.4* 1.7 1.5 1.6   Liver Function Tests:  Recent Labs Lab 11/08/13 1149 11/09/13 0354 11/10/13 0524 11/11/13 0430 11/12/13 0436  AST _2 ALT _3 ALKPHOS 90 89 104 94 116  BILITOT 0.3 0.3 0.2* 0.3 0.4  PROT 8.5* 8.3 7.8 7.6 8.0  ALBUMIN 2.1* 2.1* 2.0* 2.0* 2.0*   No results found for this basename: LIPASE, AMYLASE,  in the last 168 hours No results found for this basename: AMMONIA,  in the last 168 hours CBC:  Recent Labs Lab 11/08/13 1149 11/08/13 1634 11/09/13 0354 11/10/13 0524 11/11/13 0430 11/12/13 0436  WBC 6.2 7.6 5.5 5.9 4.8 4.9  NEUTROABS 4.1  --  3.6 3.4 2.5 2.4  HGB 8.9* 9.3* 9.5* 8.5* 8.9*  8.5*  HCT 28.6* 29.4* 30.0* 27.1* 28.6* 26.9*  MCV 74.9* 74.8* 75.4* 75.1* 75.5* 77.1*  PLT 430* 444* 359 445* 424* 393   Cardiac Enzymes:  Recent Labs Lab 11/08/13 1149 11/08/13 1634  11/08/13 2127 11/09/13 0350  TROPONINI <0.30 <0.30 <0.30 <0.30   BNP: BNP (last 3 results)  Recent Labs  10/26/13 2142 11/08/13 1149  PROBNP 2747.0* 2437.0*   CBG: No results found for this basename: GLUCAP,  in the last 168 hours     Signed:  AKULA,VIJAYA  [facility] 11/13/2013, 3:43 PM

## 2013-11-13 NOTE — Progress Notes (Signed)
CSW consulted for transportation needs. Patient will need non-emergency ambulance transport home. CSW confirmed home address with patient's family at bedside. [facility] scheduled for 2:15pm transport pickup. No other CSW needs identified - CSW signing off.   Raynaldo Opitz, [facility] Clinical Social Worker cell #: [phone]

## 2013-11-13 NOTE — Progress Notes (Signed)
[facility] is providing the following services: Hospice Pkg B - 2 lpm cont add wheelchair  If patient discharges after hours, please call [phone].   Linward Headland 11/13/2013, 12:12 PM

## 2013-11-13 NOTE — Progress Notes (Signed)
Patient is being discharged with home hospice.  reviewed discharge instructions with patient and daughter.  Order to leave foley in upon discharge.

## 2013-11-22 NOTE — Consult Note (Signed)
I have reviewed and discussed the care of this patient in detail with the nurse practitioner including pertinent patient records, physical exam findings and data. I agree with details of this encounter.  

## 2013-12-24 DEATH — deceased

## 2014-02-22 IMAGING — CR DG CHEST 1V PORT
1 series · 1 of 1 positions shown · non-contrast
Comparison: 11/08/2013.

CLINICAL DATA: CHF.

EXAM:
PORTABLE CHEST - 1 VIEW

[AP]
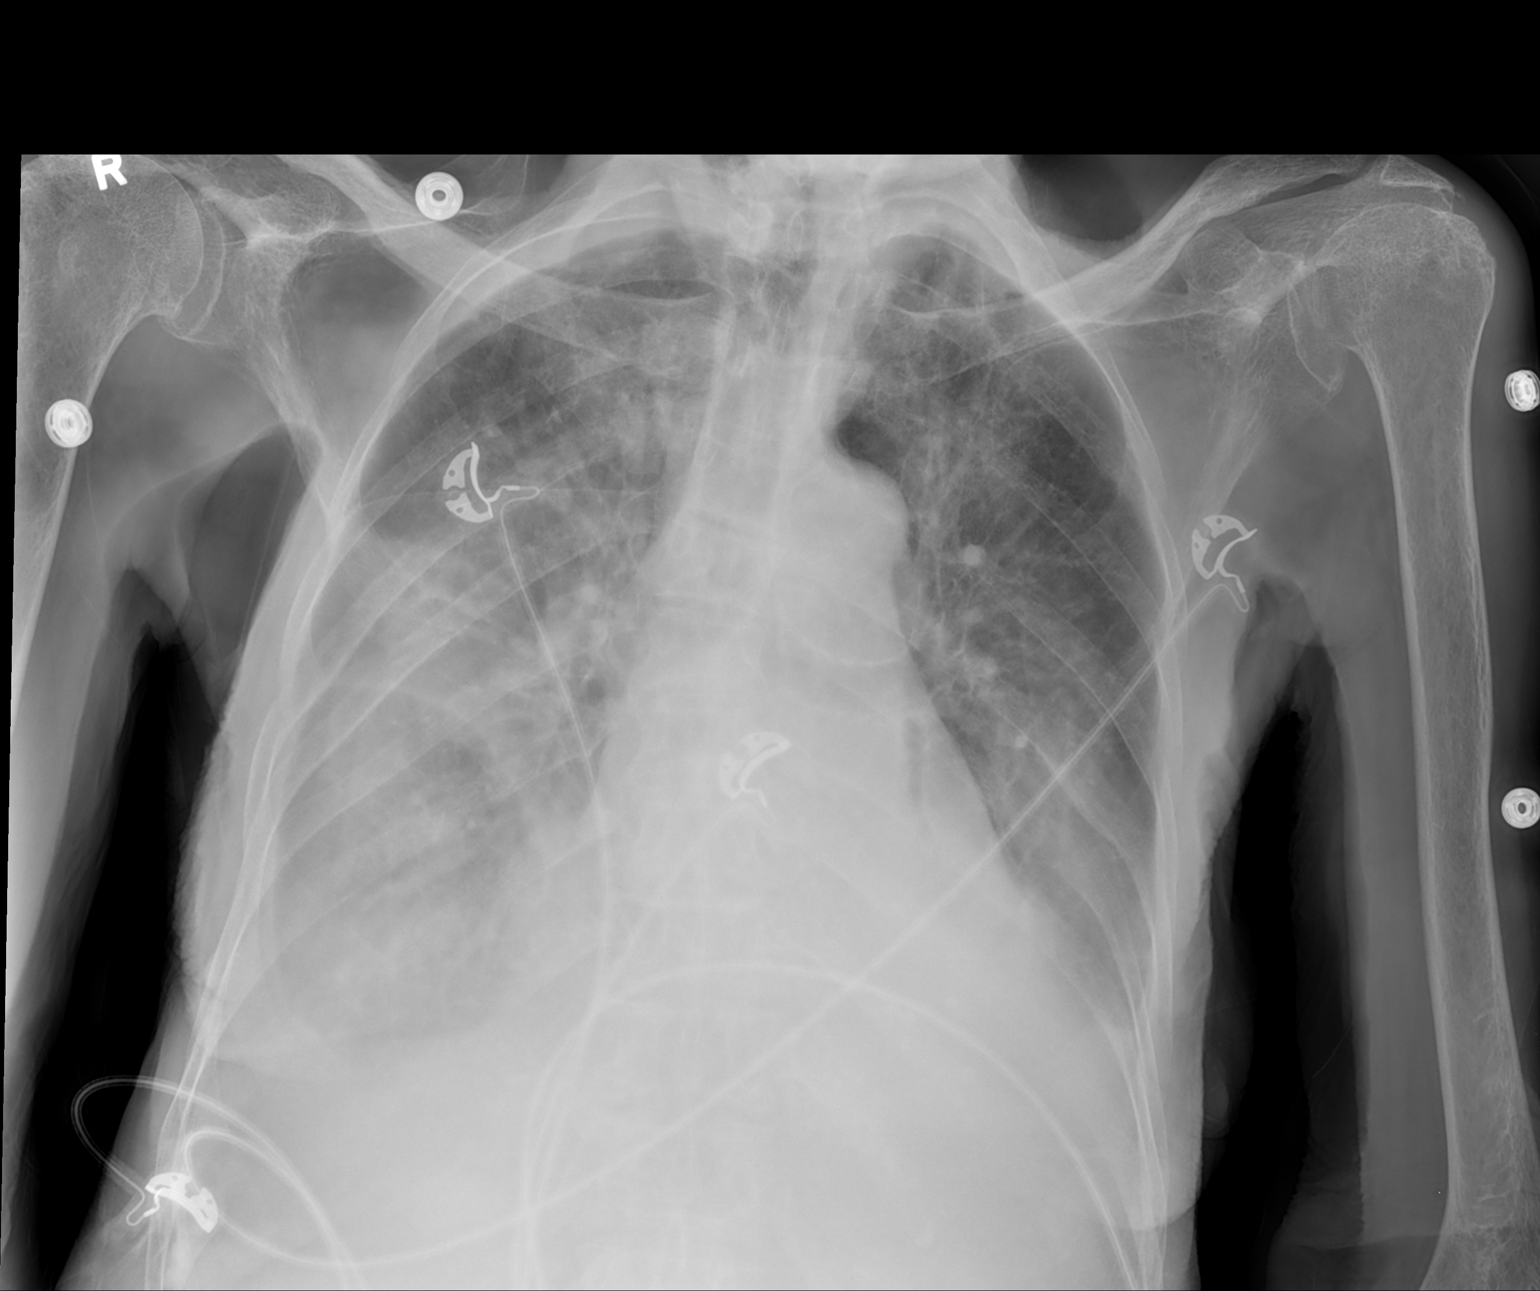

[1 of 1 positions shown; findings below may reference images not displayed]

FINDINGS: Mild cardiomegaly. Bilateral airspace disease, right greater than
left. This is worsened on the right since prior study. Bilateral
effusions again noted. No acute bony abnormality.
IMPRESSION: Bilateral airspace disease and effusions. Airspace disease on the
right has worsened since prior study.
